# Patient Record
Sex: Male | Born: 1950 | Race: White | Hispanic: No | Marital: Married | Smoking: Never smoker
Health system: Southern US, Community
[De-identification: ages and names within clinical notes are randomized; demographics above are authoritative.]

## PROBLEM LIST (undated history)

## (undated) DIAGNOSIS — R200 Anesthesia of skin: Secondary | ICD-10-CM

## (undated) DIAGNOSIS — N400 Enlarged prostate without lower urinary tract symptoms: Secondary | ICD-10-CM

## (undated) DIAGNOSIS — K2270 Barrett's esophagus without dysplasia: Secondary | ICD-10-CM

## (undated) DIAGNOSIS — I10 Essential (primary) hypertension: Secondary | ICD-10-CM

## (undated) DIAGNOSIS — R739 Hyperglycemia, unspecified: Secondary | ICD-10-CM

## (undated) DIAGNOSIS — N486 Induration penis plastica: Secondary | ICD-10-CM

## (undated) DIAGNOSIS — I714 Abdominal aortic aneurysm, without rupture, unspecified: Secondary | ICD-10-CM

## (undated) DIAGNOSIS — R202 Paresthesia of skin: Secondary | ICD-10-CM

## (undated) DIAGNOSIS — G4730 Sleep apnea, unspecified: Secondary | ICD-10-CM

## (undated) DIAGNOSIS — K219 Gastro-esophageal reflux disease without esophagitis: Secondary | ICD-10-CM

## (undated) DIAGNOSIS — E7800 Pure hypercholesterolemia, unspecified: Secondary | ICD-10-CM

## (undated) DIAGNOSIS — D151 Benign neoplasm of heart: Secondary | ICD-10-CM

## (undated) DIAGNOSIS — M51369 Other intervertebral disc degeneration, lumbar region without mention of lumbar back pain or lower extremity pain: Secondary | ICD-10-CM

## (undated) DIAGNOSIS — M5136 Other intervertebral disc degeneration, lumbar region: Secondary | ICD-10-CM

## (undated) HISTORY — PX: COLONOSCOPY: SHX174

## (undated) HISTORY — PX: EXCISION OF ATRIAL MYXOMA: SHX5821

## (undated) HISTORY — PX: TONSILLECTOMY: SUR1361

---

## 1986-09-29 HISTORY — PX: LUMBAR LAMINECTOMY: SHX95

## 2004-10-03 ENCOUNTER — Ambulatory Visit: Payer: Self-pay | Admitting: Unknown Physician Specialty

## 2008-07-18 ENCOUNTER — Ambulatory Visit: Payer: Self-pay | Admitting: Family Medicine

## 2008-07-26 ENCOUNTER — Encounter: Payer: Self-pay | Admitting: Unknown Physician Specialty

## 2008-07-30 ENCOUNTER — Encounter: Payer: Self-pay | Admitting: Unknown Physician Specialty

## 2012-01-08 ENCOUNTER — Ambulatory Visit: Payer: Self-pay | Admitting: Physical Medicine and Rehabilitation

## 2012-02-11 ENCOUNTER — Ambulatory Visit: Payer: Self-pay | Admitting: Internal Medicine

## 2012-10-19 ENCOUNTER — Ambulatory Visit: Payer: Self-pay | Admitting: General Practice

## 2012-11-26 ENCOUNTER — Ambulatory Visit: Payer: Self-pay | Admitting: General Practice

## 2012-11-26 HISTORY — PX: KNEE ARTHROSCOPY: SHX127

## 2013-05-24 ENCOUNTER — Ambulatory Visit: Payer: Self-pay | Admitting: Internal Medicine

## 2013-05-24 DIAGNOSIS — R0602 Shortness of breath: Secondary | ICD-10-CM

## 2013-07-21 ENCOUNTER — Ambulatory Visit: Payer: Self-pay | Admitting: Anesthesiology

## 2013-07-21 ENCOUNTER — Ambulatory Visit: Payer: Self-pay | Admitting: Unknown Physician Specialty

## 2013-07-21 DIAGNOSIS — I10 Essential (primary) hypertension: Secondary | ICD-10-CM

## 2013-07-22 ENCOUNTER — Ambulatory Visit: Payer: Self-pay | Admitting: Gastroenterology

## 2013-07-22 HISTORY — PX: ESOPHAGOGASTRODUODENOSCOPY: SHX1529

## 2013-07-28 HISTORY — PX: OTHER SURGICAL HISTORY: SHX169

## 2013-09-20 ENCOUNTER — Ambulatory Visit: Payer: Self-pay | Admitting: Internal Medicine

## 2014-07-07 DIAGNOSIS — R7303 Prediabetes: Secondary | ICD-10-CM | POA: Insufficient documentation

## 2014-12-26 DIAGNOSIS — G5602 Carpal tunnel syndrome, left upper limb: Secondary | ICD-10-CM | POA: Insufficient documentation

## 2014-12-26 DIAGNOSIS — M7661 Achilles tendinitis, right leg: Secondary | ICD-10-CM | POA: Insufficient documentation

## 2015-01-02 ENCOUNTER — Ambulatory Visit
Admit: 2015-01-02 | Disposition: A | Discharge status: Home / Self Care | Payer: Self-pay | Attending: Unknown Physician Specialty | Admitting: Unknown Physician Specialty

## 2015-01-19 NOTE — Op Note (Signed)
PATIENT NAME:  Marc Weaver, Marc Weaver MR#:  [id number] DATE OF BIRTH:  17-Jun-1951  DATE OF PROCEDURE:  11/26/2012  PREOPERATIVE DIAGNOSIS: Internal derangement of the right knee.   POSTOPERATIVE DIAGNOSES:  1.  Tear of the posterior horn medial meniscus, right knee.  2.  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   PROCEDURES PERFORMED: Right knee arthroscopy, partial medial meniscectomy and medial chondroplasty.   SURGEON: Laurice Record. Hooten, MD.  ANESTHESIA: General.   ESTIMATED BLOOD LOSS: Minimal.   TOURNIQUET TIME: Not used.   DRAINS: None.   INDICATIONS FOR SURGERY: The patient is a 64 year old gentleman who has been seen for complaints of right knee pain and swelling. He localized most of the pain along the medial aspect of the knee. MRI demonstrated findings consistent with meniscal pathology. After discussion of the risks and benefits of surgical intervention, the patient expressed understanding of the risks and benefits and agreed with plans for surgical intervention.   PROCEDURE IN DETAIL: The patient was brought to the Operating Room and after adequate general anesthesia was achieved, a tourniquet was placed on the patient's right thigh and leg was placed in a leg holder. All bony prominences were well padded. The patient's right knee and leg were cleaned and prepped with alcohol and DuraPrep and draped in the usual sterile fashion. A "timeout" was performed as per usual protocol. The anticipated portal sites were injected with 0.25% Marcaine with epinephrine. An anterolateral portal was created and a cannula was inserted. A moderately large effusion was evacuated. The scope was inserted and the knee was distended with fluid using the Stryker pump. The scope was advanced down the medial gutter into the medial compartment of the knee. Under visualization with the scope, an anteromedial portal was created and a hook probe was inserted. Inspection of the medial compartment  demonstrated a flap-type degenerative tear involving the posterior horn of the medial meniscus. The tear was debrided using meniscal punches and a 4.5 mm shaver. Final contouring was performed using the 50 degree ArthroCare wand. The remaining rim of meniscus was visualized and probed and felt to be stable. Inspection of the articular cartilage demonstrated some mild fraying to the medial femoral condyle. There was an area of grade 4 chondromalacia involving the medial tibial plateau along the medialmost aspect near the area of the tear. The margins were debrided and contoured using the ArthroCare wand. The scope was then advanced into the intercondylar region. The anterior cruciate ligament was visualized and probed and felt to be stable. The scope was removed from the anterolateral portal and reinserted via the anteromedial portal so as to better visualize the lateral compartment. The articular surface of the lateral compartment was in excellent condition. The lateral meniscus was visualized and probed and felt to be stable. Finally, the scope was positioned so as to visualize the patellofemoral articulation. Good patellar tracking was noted. Reasonably good articular surface was noted. The knee was irrigated with copious amounts of fluid and then suctioned dry. The anterolateral portal was reapproximated using 3-0 nylon. A combination of 0.25% Marcaine with epinephrine and 4 mg of morphine was injected via the scope. The scope was removed and the anteromedial portal was reapproximated using 3-0 nylon. A sterile dressing was applied followed by application of an ice wrap.   The patient tolerated the procedure well. He was transported to the recovery room in stable condition.   ____________________________ Laurice Record. Holley Bouche., MD jph:jm D: 11/27/2012 06:41:22 ET T: 11/27/2012 13:55:49 ET JOB#:  [id number]  cc: Laurice Record. Holley Bouche., MD, <Dictator> JAMES P Holley Bouche MD ELECTRONICALLY SIGNED 11/29/2012 6:55

## 2015-02-07 NOTE — Anesthesia Preprocedure Evaluation (Addendum)
Anesthesia Evaluation  Patient identified by MRN, date of birth, ID band Patient awake    Reviewed: Allergy & Precautions, NPO status , Patient's Chart, lab work & pertinent test results  Airway Mallampati: II  TM Distance: >3 FB Neck ROM: Full    Dental no notable dental hx.    Pulmonary neg pulmonary ROS,  breath sounds clear to auscultation  Pulmonary exam normal       Cardiovascular hypertension, Pt. on medications negative cardio ROS Normal cardiovascular examRhythm:Regular Rate:Normal     Neuro/Psych negative neurological ROS  negative psych ROS   GI/Hepatic negative GI ROS, Neg liver ROS, GERD-  Medicated and Controlled,  Endo/Other  negative endocrine ROS  Renal/GU negative Renal ROS  negative genitourinary   Musculoskeletal negative musculoskeletal ROS (+)   Abdominal   Peds negative pediatric ROS (+)  Hematology negative hematology ROS (+)   Anesthesia Other Findings   Reproductive/Obstetrics negative OB ROS                            Anesthesia Physical Anesthesia Plan  ASA: II  Anesthesia Plan: Regional   Post-op Pain Management: MAC Combined w/ Regional for Post-op pain   Induction: Intravenous  Airway Management Planned:   Additional Equipment:   Intra-op Plan:   Post-operative Plan: Extubation in OR  Informed Consent: I have reviewed the patients History and Physical, chart, labs and discussed the procedure including the risks, benefits and alternatives for the proposed anesthesia with the patient or authorized representative who has indicated his/her understanding and acceptance.   Dental advisory given  Plan Discussed with: CRNA  Anesthesia Plan Comments:         Anesthesia Quick Evaluation

## 2015-02-09 ENCOUNTER — Ambulatory Visit: Payer: 59 | Admitting: Anesthesiology

## 2015-02-09 ENCOUNTER — Encounter
Admission: RE | Disposition: A | Discharge status: Home / Self Care | Payer: Self-pay | Source: Ambulatory Visit | Attending: Unknown Physician Specialty

## 2015-02-09 ENCOUNTER — Ambulatory Visit
Admission: RE | Admit: 2015-02-09 | Discharge: 2015-02-09 | Disposition: A | Discharge status: Home / Self Care | Payer: 59 | Source: Ambulatory Visit | Attending: Unknown Physician Specialty | Admitting: Unknown Physician Specialty

## 2015-02-09 DIAGNOSIS — K219 Gastro-esophageal reflux disease without esophagitis: Secondary | ICD-10-CM | POA: Insufficient documentation

## 2015-02-09 DIAGNOSIS — G5602 Carpal tunnel syndrome, left upper limb: Secondary | ICD-10-CM

## 2015-02-09 DIAGNOSIS — Z79899 Other long term (current) drug therapy: Secondary | ICD-10-CM | POA: Insufficient documentation

## 2015-02-09 DIAGNOSIS — I10 Essential (primary) hypertension: Secondary | ICD-10-CM | POA: Diagnosis not present

## 2015-02-09 HISTORY — DX: Benign prostatic hyperplasia without lower urinary tract symptoms: N40.0

## 2015-02-09 HISTORY — PX: CARPAL TUNNEL RELEASE: SHX101

## 2015-02-09 HISTORY — DX: Other intervertebral disc degeneration, lumbar region: M51.36

## 2015-02-09 HISTORY — DX: Barrett's esophagus without dysplasia: K22.70

## 2015-02-09 HISTORY — DX: Other intervertebral disc degeneration, lumbar region without mention of lumbar back pain or lower extremity pain: M51.369

## 2015-02-09 HISTORY — DX: Induration penis plastica: N48.6

## 2015-02-09 HISTORY — DX: Benign neoplasm of heart: D15.1

## 2015-02-09 HISTORY — DX: Anesthesia of skin: R20.2

## 2015-02-09 HISTORY — DX: Hyperglycemia, unspecified: R73.9

## 2015-02-09 HISTORY — DX: Essential (primary) hypertension: I10

## 2015-02-09 HISTORY — DX: Sleep apnea, unspecified: G47.30

## 2015-02-09 HISTORY — DX: Paresthesia of skin: R20.0

## 2015-02-09 HISTORY — DX: Pure hypercholesterolemia, unspecified: E78.00

## 2015-02-09 SURGERY — CARPAL TUNNEL RELEASE
Anesthesia: Regional | Laterality: Left | Wound class: Clean

## 2015-02-09 MED ORDER — BUPIVACAINE HCL (PF) 0.5 % IJ SOLN
INTRAMUSCULAR | Status: DC | PRN
  Administered 2015-02-09: 6 mL

## 2015-02-09 MED ORDER — KETOROLAC TROMETHAMINE 30 MG/ML IJ SOLN: 30.0000 mg | Freq: Once | INTRAMUSCULAR | Status: DC | PRN

## 2015-02-09 MED ORDER — LACTATED RINGERS IV SOLN
INTRAVENOUS | Status: DC
  Administered 2015-02-09: 11:00:00 via INTRAVENOUS

## 2015-02-09 MED ORDER — HYDROMORPHONE HCL 1 MG/ML IJ SOLN: 0.2500 mg | INTRAMUSCULAR | Status: DC | PRN

## 2015-02-09 MED ORDER — HYDROCODONE-ACETAMINOPHEN 5-325 MG PO TABS: 1.0000 | ORAL_TABLET | Freq: Four times a day (QID) | ORAL | Status: DC | PRN

## 2015-02-09 MED ORDER — OXYCODONE HCL 5 MG PO TABS: 5.0000 mg | ORAL_TABLET | Freq: Once | ORAL | Status: DC | PRN

## 2015-02-09 MED ORDER — PROPOFOL 10 MG/ML IV BOLUS
INTRAVENOUS | Status: DC | PRN
  Administered 2015-02-09: 200 mg via INTRAVENOUS

## 2015-02-09 MED ORDER — MIDAZOLAM HCL 5 MG/5ML IJ SOLN
INTRAMUSCULAR | Status: DC | PRN
  Administered 2015-02-09: 2 mg via INTRAVENOUS

## 2015-02-09 MED ORDER — LIDOCAINE HCL (CARDIAC) 20 MG/ML IV SOLN
INTRAVENOUS | Status: DC | PRN
  Administered 2015-02-09: 50 mg via INTRATRACHEAL

## 2015-02-09 MED ORDER — DEXAMETHASONE SODIUM PHOSPHATE 4 MG/ML IJ SOLN
INTRAMUSCULAR | Status: DC | PRN
  Administered 2015-02-09: 4 mg via INTRAVENOUS

## 2015-02-09 MED ORDER — FENTANYL CITRATE (PF) 100 MCG/2ML IJ SOLN
INTRAMUSCULAR | Status: DC | PRN
  Administered 2015-02-09: 100 ug via INTRAVENOUS

## 2015-02-09 MED ORDER — OXYCODONE HCL 5 MG/5ML PO SOLN: 5.0000 mg | Freq: Once | ORAL | Status: DC | PRN

## 2015-02-09 MED ORDER — ONDANSETRON HCL 4 MG/2ML IJ SOLN
INTRAMUSCULAR | Status: DC | PRN
  Administered 2015-02-09: 4 mg via INTRAVENOUS

## 2015-02-09 MED ORDER — PROMETHAZINE HCL 25 MG/ML IJ SOLN: 6.2500 mg | INTRAMUSCULAR | Status: DC | PRN

## 2015-02-09 MED ORDER — GLYCOPYRROLATE 0.2 MG/ML IJ SOLN
INTRAMUSCULAR | Status: DC | PRN
  Administered 2015-02-09: 0.2 mg via INTRAVENOUS

## 2015-02-09 SURGICAL SUPPLY — 26 items
BANDAGE ELASTIC 2 VELCRO NS LF (GAUZE/BANDAGES/DRESSINGS) ×3 IMPLANT
BNDG ESMARK 4X12 TAN STRL LF (GAUZE/BANDAGES/DRESSINGS) ×3 IMPLANT
COVER LIGHT HANDLE FLEXIBLE (MISCELLANEOUS) ×3 IMPLANT
CUFF TOURN SGL QUICK 18 (TOURNIQUET CUFF) ×3 IMPLANT
DURAPREP 26ML APPLICATOR (WOUND CARE) ×3 IMPLANT
GAUZE SPONGE 4X4 12PLY STRL (GAUZE/BANDAGES/DRESSINGS) ×3 IMPLANT
GLOVE BIO SURGEON STRL SZ7.5 (GLOVE) ×3 IMPLANT
GLOVE BIO SURGEON STRL SZ8 (GLOVE) ×6 IMPLANT
GLOVE INDICATOR 8.0 STRL GRN (GLOVE) ×3 IMPLANT
GOWN STRL REUS W/ TWL LRG LVL3 (GOWN DISPOSABLE) ×2 IMPLANT
GOWN STRL REUS W/TWL LRG LVL3 (GOWN DISPOSABLE) ×4
LOOP VESSEL RED MINI 1.3X0.9 (MISCELLANEOUS) ×1 IMPLANT
LOOPS RED MINI 1.3MMX0.9MM (MISCELLANEOUS) ×2
NS IRRIG 500ML POUR BTL (IV SOLUTION) ×3 IMPLANT
PACK EXTREMITY ARMC (MISCELLANEOUS) ×3 IMPLANT
PAD GROUND ADULT SPLIT (MISCELLANEOUS) ×3 IMPLANT
PADDING CAST 2X4YD ST (MISCELLANEOUS) ×2
PADDING CAST BLEND 2X4 STRL (MISCELLANEOUS) ×1 IMPLANT
SOL PREP PVP 2OZ (MISCELLANEOUS) ×3
SOLUTION PREP PVP 2OZ (MISCELLANEOUS) ×1 IMPLANT
SPLINT CAST 1 STEP 3X12 (MISCELLANEOUS) ×3 IMPLANT
STOCKINETTE ORTHO 6X25 (MISCELLANEOUS) ×3 IMPLANT
STRAP BODY AND KNEE 60X3 (MISCELLANEOUS) ×3 IMPLANT
SUT ETHILON 4-0 (SUTURE) ×2
SUT ETHILON 4-0 FS2 18XMFL BLK (SUTURE) ×1
SUTURE ETHLN 4-0 FS2 18XMF BLK (SUTURE) ×1 IMPLANT

## 2015-02-09 NOTE — Op Note (Addendum)
DATE OF SURGERY:  02/09/2015  PATIENT NAME:  Marc Weaver   DOB: 1951-05-01  MRN: [id number]  PRE-OPERATIVE DIAGNOSIS: Left carpal tunnel syndrome  POST-OPERATIVE DIAGNOSIS:  Same  PROCEDURE:  Left carpal tunnel release  SURGEON: Dr. Leanor Kail, Brooke Bonito. M.D.  ANESTHESIA: Supraclavicular block plus general  ESTIMATED BLOOD LOSS: Negligible mL   TOURNIQUET TIME: 13  J minutes  DRAINS: None  INDICATIONS FOR SURGERY: Marc Weaver is a 64 y.o. year old male with a long history of numbness and paresthesias to the left hand. EMG/nerve conduction studies demonstrated findings consistent with carpal tunnel syndrome.The patient had not seen any significant improvement despite conservative nonsurgical intervention. After discussion of the risks and benefits of surgical intervention, the patient expressed understanding of the risks benefits and agree with plans for carpal tunnel release.   PROCEDURE IN DETAIL: The patient was brought into the operating room and after adequate general anesthesia, a tourniquet was placed on the patient's left upper arm.The left hand and arm were prepped with alcohol and Duraprep and draped in the usual sterile fashion. A "time-out" was performed as per usual protocol. The hand and forearm were exsanguinated using an Esmarch and the tourniquet was inflated to 250 mmHg.  An incision was made just ulnar to the thenar palmar crease. Dissection was carried down through the palmar fascia to the transverse carpal ligament. The transverse carpal ligament was sharply incised, taking care to protect the underlying structures with the carpal tunnel. Complete release of the transverse carpal ligament was achieved. There was no evidence of ganglion cyst or lipoma within the carpal tunnel. The wound was irrigated with copious amounts of normal saline with antibiotic solution. The skin was then re-approximated with interrupted sutures of #4-0 nylon. A sterile dressing  was applied followed by application of a volar splint. The tourniquet was deflated with a total tourniquet time of 13 minutes.  The patient tolerated the procedure well and was transported to the PACU in stable condition.   Dr. Mariel Kansky. M.D.

## 2015-02-09 NOTE — H&P (Signed)
  H and P reviewed. No changes. Uploaded at later date. 

## 2015-02-09 NOTE — Transfer of Care (Signed)
Immediate Anesthesia Transfer of Care Note  Patient: Marc Weaver  Procedure(s) Performed: Procedure(s): CARPAL TUNNEL RELEASE (Left)  Patient Location: PACU  Anesthesia Type: Regional  Level of Consciousness: awake, alert  and patient cooperative  Airway and Oxygen Therapy: Patient Spontanous Breathing and Patient connected to supplemental oxygen  Post-op Assessment: Post-op Vital signs reviewed, Patient's Cardiovascular Status Stable, Respiratory Function Stable, Patent Airway and No signs of Nausea or vomiting  Post-op Vital Signs: Reviewed and stable  Complications: No apparent anesthesia complications

## 2015-02-09 NOTE — Anesthesia Procedure Notes (Addendum)
Procedure Name: LMA Insertion Date/Time: 02/09/2015 11:50 AM Performed by: Londell Moh Pre-anesthesia Checklist: Patient identified, Emergency Drugs available, Suction available, Timeout performed and Patient being monitored Patient Re-evaluated:Patient Re-evaluated prior to inductionOxygen Delivery Method: Circle system utilized Preoxygenation: Pre-oxygenation with 100% oxygen Intubation Type: IV induction LMA: LMA inserted LMA Size: 4.0 Number of attempts: 1 Placement Confirmation: positive ETCO2 and breath sounds checked- equal and bilateral Tube secured with: Tape   Anesthesia Regional Block:  Supraclavicular block  Pre-Anesthetic Checklist: ,, timeout performed, Correct Patient, Correct Site, Correct Laterality, Correct Procedure, Correct Position, site marked, Risks and benefits discussed,  Surgical consent,  Pre-op evaluation,  At surgeon's request and post-op pain management  Laterality: Left  Prep: chloraprep       Needles:  Injection technique: Single-shot  Needle Type: Echogenic Stimulator Needle      Needle Gauge: 21 and 21 G  Needle insertion depth: 3 cm   Additional Needles:  Procedures: ultrasound guided (picture in chart) and nerve stimulator Supraclavicular block  Nerve Stimulator or Paresthesia:  Response: bicep contraction, 0.45 mA,   Additional Responses:   Narrative:  Injection made incrementally with aspirations every 5 mL.  Performed by: Personally   Additional Notes: Functioning IV was confirmed and monitors applied.  Sterile prep and drape,hand hygiene and sterile gloves were used.Ultrasound guidance: relevant anatomy identified, needle position confirmed, local anesthetic spread visualized around nerve(s)., vascular puncture avoided.  Image printed for medical record.  Negative aspiration and negative test dose prior to incremental administration of local anesthetic. The patient tolerated the procedure well. Vitals signes recorded in RN  notes.

## 2015-02-09 NOTE — Anesthesia Postprocedure Evaluation (Signed)
  Anesthesia Post-op Note  Patient: Marc Weaver  Procedure(s) Performed: Procedure(s): CARPAL TUNNEL RELEASE (Left)  Anesthesia type:Regional  Patient location: PACU  Post pain: Pain level controlled  Post assessment: Post-op Vital signs reviewed, Patient's Cardiovascular Status Stable, Respiratory Function Stable, Patent Airway and No signs of Nausea or vomiting  Post vital signs: Reviewed and stable  Last Vitals:  Filed Vitals:   02/09/15 1245  BP: 115/89  Pulse: 66  Temp:   Resp: 17    Level of consciousness: awake, alert  and patient cooperative  Complications: No apparent anesthesia complications

## 2015-02-09 NOTE — Discharge Instructions (Signed)
General Anesthesia, Care After Refer to this sheet in the next few weeks. These instructions provide you with information on caring for yourself after your procedure. Your health care provider may also give you more specific instructions. Your treatment has been planned according to current medical practices, but problems sometimes occur. Call your health care provider if you have any problems or questions after your procedure. WHAT TO EXPECT AFTER THE PROCEDURE After the procedure, it is typical to experience:  Sleepiness.  Nausea and vomiting. HOME CARE INSTRUCTIONS  For the first 24 hours after general anesthesia:  Have a responsible person with you.  Do not drive a car. If you are alone, do not take public transportation.  Do not drink alcohol.  Do not take medicine that has not been prescribed by your health care provider.  Do not sign important papers or make important decisions.  You may resume a normal diet and activities as directed by your health care provider.  Change bandages (dressings) as directed.  If you have questions or problems that seem related to general anesthesia, call the hospital and ask for the anesthetist or anesthesiologist on call. SEEK MEDICAL CARE IF:  You have nausea and vomiting that continue the day after anesthesia.  You develop a rash. SEEK IMMEDIATE MEDICAL CARE IF:   You have difficulty breathing.  You have chest pain.  You have any allergic problems.  Document Released: 12/22/2000 Document Revised: 09/20/2013 Document Reviewed: 03/31/2013 [facility] Patient Information 2015 [place], [place]. This information is not intended to replace advice given to you by your health care provider. Make sure you discuss any questions you have with your health care provider.  Carpal Tunnel Release Carpal tunnel release is done to relieve the pressure on the nerves and tendons on the bottom side of your wrist.  LET YOUR CAREGIVER KNOW ABOUT:    Allergies to food or medicine.  Medicines taken, including vitamins, herbs, eyedrops, over-the-counter medicines, and creams.  Use of steroids (by mouth or creams).  Previous problems with anesthetics or numbing medicines.  History of bleeding problems or blood clots.  Previous surgery.  Other health problems, including diabetes and kidney problems.  Possibility of pregnancy, if this applies. RISKS AND COMPLICATIONS  Some problems that may happen after this procedure include:  Infection.  Damage to the nerves, arteries or tendons could occur. This would be very uncommon.  Bleeding. BEFORE THE PROCEDURE   This surgery may be done while you are asleep (general anesthetic) or may be done under a block where only your forearm and the surgical area is numb.  If the surgery is done under a block, the numbness will gradually wear off within several hours after surgery. HOME CARE INSTRUCTIONS   Have a responsible person with you for 24 hours.  Do not drive a car or use public transportation for 24 hours.  Only take over-the-counter or prescription medicines for pain, discomfort, or fever as directed by your caregiver. Take them as directed.  You may put ice on the palm side of the affected wrist.  Put ice in a plastic bag.  Place a towel between your skin and the bag.  Leave the ice on for 20 to 30 minutes, 4 times per day.  If you were given a splint to keep your wrist from bending, use it as directed. It is important to wear the splint at night or as directed. Use the splint for as long as you have pain or numbness in your hand, arm,  or wrist. This may take 1 to 2 months.  Keep your hand raised (elevated) above the level of your heart as much as possible. This keeps swelling down and helps with discomfort.  Change bandages (dressings) as directed.  Keep the wound clean and dry. SEEK MEDICAL CARE IF:   You develop pain not relieved with medications.  You develop  numbness of your hand.  You develop bleeding from your surgical site.  You have an oral temperature above 102 F (38.9 C).  You develop redness or swelling of the surgical site.  You develop new, unexplained problems. SEEK IMMEDIATE MEDICAL CARE IF:   You develop a rash.  You have difficulty breathing.  You develop any reaction or side effects to medications given. Document Released: 12/06/2003 Document Revised: 12/08/2011 Document Reviewed: 07/22/2007 [facility] Patient Information 2015 [place], [place]. This information is not intended to replace advice given to you by your health care provider. Make sure you discuss any questions you have with your health care provider.

## 2015-02-12 ENCOUNTER — Encounter: Payer: Self-pay | Admitting: Unknown Physician Specialty

## 2015-09-25 ENCOUNTER — Encounter: Payer: Self-pay | Admitting: *Deleted

## 2015-10-02 NOTE — Discharge Instructions (Signed)

## 2015-10-03 ENCOUNTER — Ambulatory Visit
Admission: RE | Admit: 2015-10-03 | Discharge: 2015-10-03 | Disposition: A | Discharge status: Home / Self Care | Payer: 59 | Source: Ambulatory Visit | Attending: Gastroenterology | Admitting: Gastroenterology

## 2015-10-03 ENCOUNTER — Ambulatory Visit: Payer: 59 | Admitting: Anesthesiology

## 2015-10-03 ENCOUNTER — Encounter
Admission: RE | Disposition: A | Discharge status: Home / Self Care | Payer: Self-pay | Source: Ambulatory Visit | Attending: Gastroenterology

## 2015-10-03 DIAGNOSIS — N486 Induration penis plastica: Secondary | ICD-10-CM | POA: Insufficient documentation

## 2015-10-03 DIAGNOSIS — K2270 Barrett's esophagus without dysplasia: Secondary | ICD-10-CM | POA: Diagnosis present

## 2015-10-03 DIAGNOSIS — G4730 Sleep apnea, unspecified: Secondary | ICD-10-CM | POA: Insufficient documentation

## 2015-10-03 DIAGNOSIS — K219 Gastro-esophageal reflux disease without esophagitis: Secondary | ICD-10-CM | POA: Diagnosis not present

## 2015-10-03 DIAGNOSIS — E7800 Pure hypercholesterolemia, unspecified: Secondary | ICD-10-CM | POA: Insufficient documentation

## 2015-10-03 DIAGNOSIS — N400 Enlarged prostate without lower urinary tract symptoms: Secondary | ICD-10-CM | POA: Insufficient documentation

## 2015-10-03 DIAGNOSIS — K5730 Diverticulosis of large intestine without perforation or abscess without bleeding: Secondary | ICD-10-CM | POA: Diagnosis not present

## 2015-10-03 DIAGNOSIS — R739 Hyperglycemia, unspecified: Secondary | ICD-10-CM | POA: Insufficient documentation

## 2015-10-03 DIAGNOSIS — Z791 Long term (current) use of non-steroidal anti-inflammatories (NSAID): Secondary | ICD-10-CM | POA: Diagnosis not present

## 2015-10-03 DIAGNOSIS — M5137 Other intervertebral disc degeneration, lumbosacral region: Secondary | ICD-10-CM | POA: Diagnosis not present

## 2015-10-03 DIAGNOSIS — K210 Gastro-esophageal reflux disease with esophagitis: Secondary | ICD-10-CM | POA: Diagnosis present

## 2015-10-03 DIAGNOSIS — Z79899 Other long term (current) drug therapy: Secondary | ICD-10-CM | POA: Diagnosis not present

## 2015-10-03 DIAGNOSIS — I10 Essential (primary) hypertension: Secondary | ICD-10-CM | POA: Diagnosis not present

## 2015-10-03 DIAGNOSIS — R194 Change in bowel habit: Secondary | ICD-10-CM | POA: Diagnosis present

## 2015-10-03 DIAGNOSIS — Z7982 Long term (current) use of aspirin: Secondary | ICD-10-CM | POA: Insufficient documentation

## 2015-10-03 HISTORY — PX: COLONOSCOPY: SHX5424

## 2015-10-03 HISTORY — PX: ESOPHAGOGASTRODUODENOSCOPY: SHX5428

## 2015-10-03 SURGERY — COLONOSCOPY
Anesthesia: Monitor Anesthesia Care | Wound class: Contaminated

## 2015-10-03 MED ORDER — LIDOCAINE HCL (CARDIAC) 20 MG/ML IV SOLN
INTRAVENOUS | Status: DC | PRN
Start: 2015-10-03 — End: 2015-10-03
  Administered 2015-10-03: 40 mg via INTRAVENOUS

## 2015-10-03 MED ORDER — ACETAMINOPHEN 325 MG PO TABS: 325.0000 mg | ORAL_TABLET | ORAL | Status: DC | PRN

## 2015-10-03 MED ORDER — OXYCODONE HCL 5 MG/5ML PO SOLN: 5.0000 mg | Freq: Once | ORAL | Status: DC | PRN

## 2015-10-03 MED ORDER — ACETAMINOPHEN 160 MG/5ML PO SOLN: 325.0000 mg | ORAL | Status: DC | PRN

## 2015-10-03 MED ORDER — SODIUM CHLORIDE 0.9 % IV SOLN: INTRAVENOUS | Status: DC

## 2015-10-03 MED ORDER — LACTATED RINGERS IV SOLN
INTRAVENOUS | Status: DC
  Administered 2015-10-03: 08:00:00 via INTRAVENOUS

## 2015-10-03 MED ORDER — OXYCODONE HCL 5 MG PO TABS: 5.0000 mg | ORAL_TABLET | Freq: Once | ORAL | Status: DC | PRN

## 2015-10-03 MED ORDER — DEXAMETHASONE SODIUM PHOSPHATE 4 MG/ML IJ SOLN: 8.0000 mg | Freq: Once | INTRAMUSCULAR | Status: DC | PRN

## 2015-10-03 MED ORDER — GLYCOPYRROLATE 0.2 MG/ML IJ SOLN
INTRAMUSCULAR | Status: DC | PRN
  Administered 2015-10-03: 0.2 mg via INTRAVENOUS

## 2015-10-03 MED ORDER — PROPOFOL 10 MG/ML IV BOLUS
INTRAVENOUS | Status: DC | PRN
  Administered 2015-10-03 (×7): 50 mg via INTRAVENOUS

## 2015-10-03 MED ORDER — LACTATED RINGERS IV SOLN: 500.0000 mL | INTRAVENOUS | Status: DC

## 2015-10-03 MED ORDER — FENTANYL CITRATE (PF) 100 MCG/2ML IJ SOLN: 25.0000 ug | INTRAMUSCULAR | Status: DC | PRN

## 2015-10-03 MED ORDER — STERILE WATER FOR IRRIGATION IR SOLN
Status: DC | PRN
  Administered 2015-10-03: 09:00:00

## 2015-10-03 SURGICAL SUPPLY — 41 items
BALLN DILATOR 10-12 8 (BALLOONS)
BALLN DILATOR 12-15 8 (BALLOONS)
BALLN DILATOR 15-18 8 (BALLOONS)
BALLN DILATOR CRE 0-12 8 (BALLOONS)
BALLN DILATOR ESOPH 8 10 CRE (MISCELLANEOUS) IMPLANT
BALLOON DILATOR 12-15 8 (BALLOONS) IMPLANT
BALLOON DILATOR 15-18 8 (BALLOONS) IMPLANT
BALLOON DILATOR CRE 0-12 8 (BALLOONS) IMPLANT
BLOCK BITE 60FR ADLT L/F GRN (MISCELLANEOUS) ×3 IMPLANT
CANISTER SUCT 1200ML W/VALVE (MISCELLANEOUS) ×3 IMPLANT
FCP ESCP3.2XJMB 240X2.8X (MISCELLANEOUS)
FORCEPS BIOP RAD 4 LRG CAP 4 (CUTTING FORCEPS) ×3 IMPLANT
FORCEPS BIOP RJ4 240 W/NDL (MISCELLANEOUS)
FORCEPS ESCP3.2XJMB 240X2.8X (MISCELLANEOUS) IMPLANT
GOWN CVR UNV OPN BCK APRN NK (MISCELLANEOUS) ×1 IMPLANT
GOWN ISOL THUMB LOOP REG UNIV (MISCELLANEOUS) ×2
GOWN STRL REUS W/ TWL LRG LVL3 (GOWN DISPOSABLE) ×1 IMPLANT
GOWN STRL REUS W/TWL LRG LVL3 (GOWN DISPOSABLE) ×2
HEMOCLIP INSTINCT (CLIP) IMPLANT
INJECTOR VARIJECT VIN23 (MISCELLANEOUS) IMPLANT
KIT CO2 TUBING (TUBING) IMPLANT
KIT DEFENDO VALVE AND CONN (KITS) IMPLANT
KIT ENDO PROCEDURE OLY (KITS) ×3 IMPLANT
LIGATOR MULTIBAND 6SHOOTER MBL (MISCELLANEOUS) IMPLANT
MARKER SPOT ENDO TATTOO 5ML (MISCELLANEOUS) IMPLANT
PAD GROUND ADULT SPLIT (MISCELLANEOUS) IMPLANT
SNARE SHORT THROW 13M SML OVAL (MISCELLANEOUS) IMPLANT
SNARE SHORT THROW 30M LRG OVAL (MISCELLANEOUS) IMPLANT
SPOT EX ENDOSCOPIC TATTOO (MISCELLANEOUS)
SUCTION POLY TRAP 4CHAMBER (MISCELLANEOUS) IMPLANT
SYR INFLATION 60ML (SYRINGE) IMPLANT
TRAP SUCTION POLY (MISCELLANEOUS) IMPLANT
TUBING CONN 6MMX3.1M (TUBING)
TUBING SUCTION CONN 0.25 STRL (TUBING) IMPLANT
UNDERPAD 30X60 958B10 (PK) (MISCELLANEOUS) IMPLANT
VALVE BIOPSY ENDO (VALVE) IMPLANT
VARIJECT INJECTOR VIN23 (MISCELLANEOUS)
WATER AUXILLARY (MISCELLANEOUS) IMPLANT
WATER STERILE IRR 250ML POUR (IV SOLUTION) ×3 IMPLANT
WATER STERILE IRR 500ML POUR (IV SOLUTION) IMPLANT
WIRE CRE 18-20MM 8CM F G (MISCELLANEOUS) IMPLANT

## 2015-10-03 NOTE — Op Note (Signed)
[facility] Gastroenterology Patient Name: Marc Weaver Procedure Date: 10/03/2015 8:45 AM MRN: [id number] Account #: [id number] Date of Birth: [birth year] Admit Type: Outpatient Age: 65 Room: [facility] OR ROOM 01 Gender: Male Note Status: Finalized Procedure:         Colonoscopy Indications:       Chronic diarrhea, Change in bowel habits Providers:         Lupita Dawn. Candace Cruise, MD Medicines:         Monitored Anesthesia Care Complications:     No immediate complications. Procedure:         Pre-Anesthesia Assessment:                    - Prior to the procedure, a History and Physical was                     performed, and patient medications, allergies and                     sensitivities were reviewed. The patient's tolerance of                     previous anesthesia was reviewed.                    - The risks and benefits of the procedure and the sedation                     options and risks were discussed with the patient. All                     questions were answered and informed consent was obtained.                    - After reviewing the risks and benefits, the patient was                     deemed in satisfactory condition to undergo the procedure.                    After obtaining informed consent, the colonoscope was                     passed under direct vision. Throughout the procedure, the                     patient's blood pressure, pulse, and oxygen saturations                     were monitored continuously. The Olympus CF-HQ190L                     Colonoscope (S#. [id number]) was introduced through the anus                     and advanced to the the terminal ileum, with                     identification of the appendiceal orifice and IC valve.                     The colonoscopy was performed without difficulty. The                     patient tolerated the procedure well. The quality  of the                     bowel preparation was fair. Findings:     Decreased sphincter tone      The terminal ileum appeared normal. Biopsies were taken with a cold       forceps for histology.      Multiple small-mouthed diverticula were found in the sigmoid colon.       Biopsies for histology were taken with a cold forceps from the entire       colon for evaluation of microscopic colitis.      The exam was otherwise without abnormality. Impression:        - The examined portion of the ileum was normal. Biopsied.                    - Diverticulosis in the sigmoid colon. Biopsied.                    - The examination was otherwise normal. Recommendation:    - Discharge patient to home.                    - Await pathology results.                    - High fiber diet daily. Procedure Code(s): --- Professional ---                    [id number], Colonoscopy, flexible; with biopsy, single or                     multiple Diagnosis Code(s): --- Professional ---                    K52.9, Noninfective gastroenteritis and colitis,                     unspecified                    R19.4, Change in bowel habit                    K57.30, Diverticulosis of large intestine without                     perforation or abscess without bleeding CPT copyright 2014 [employer]. All rights reserved. The codes documented in this report are preliminary and upon coder review may  be revised to meet current compliance requirements. Hulen Luster, MD 10/03/2015 9:34:02 AM This report has been signed electronically. Number of Addenda: 0 Note Initiated On: 10/03/2015 8:45 AM Scope Withdrawal Time: 0 hours 4 minutes 24 seconds  Total Procedure Duration: 0 hours 6 minutes 36 seconds       [facility]

## 2015-10-03 NOTE — Transfer of Care (Signed)
Immediate Anesthesia Transfer of Care Note  Patient: Marc Weaver  Procedure(s) Performed: Procedure(s): COLONOSCOPY (N/A) ESOPHAGOGASTRODUODENOSCOPY (EGD) (N/A)  Patient Location: PACU  Anesthesia Type: MAC  Level of Consciousness: awake, alert  and patient cooperative  Airway and Oxygen Therapy: Patient Spontanous Breathing and Patient connected to supplemental oxygen  Post-op Assessment: Post-op Vital signs reviewed, Patient's Cardiovascular Status Stable, Respiratory Function Stable, Patent Airway and No signs of Nausea or vomiting  Post-op Vital Signs: Reviewed and stable  Complications: No apparent anesthesia complications

## 2015-10-03 NOTE — H&P (Signed)
  Date of Initial H&P: 09/18/2015  History reviewed, patient examined, no change in status, stable for surgery.

## 2015-10-03 NOTE — Op Note (Signed)
[facility] Gastroenterology Patient Name: Marc Weaver Procedure Date: 10/03/2015 8:45 AM MRN: [id number] Account #: [id number] Date of Birth: 05-30-1951 Admit Type: Outpatient Age: 65 Room: [facility] OR ROOM 01 Gender: Male Note Status: Finalized Procedure:         Upper GI endoscopy Indications:       Follow-up of esophageal reflux, Follow-up of Barrett's                     esophagus Providers:         Lupita Dawn. Candace Cruise, MD Referring MD:      Ramonita Lab, MD (Referring MD) Medicines:         Monitored Anesthesia Care Complications:     No immediate complications. Procedure:         Pre-Anesthesia Assessment:                    - Prior to the procedure, a History and Physical was                     performed, and patient medications, allergies and                     sensitivities were reviewed. The patient's tolerance of                     previous anesthesia was reviewed.                    - The risks and benefits of the procedure and the sedation                     options and risks were discussed with the patient. All                     questions were answered and informed consent was obtained.                    - After reviewing the risks and benefits, the patient was                     deemed in satisfactory condition to undergo the procedure.                    After obtaining informed consent, the endoscope was passed                     under direct vision. Throughout the procedure, the                     patient's blood pressure, pulse, and oxygen saturations                     were monitored continuously. The was introduced through                     the mouth, and advanced to the second part of duodenum.                     The upper GI endoscopy was accomplished without                     difficulty. The patient tolerated the procedure well. Findings:      There were esophageal mucosal changes suspicious for short-segment  Barrett's esophagus  present at the gastroesophageal junction. The       maximum longitudinal extent of these mucosal changes was 2 cm in length.       Mucosa was biopsied with a cold forceps for histology in 4 quadrants at       the gastroesophageal junction. One specimen bottle was sent to pathology.      The exam was otherwise without abnormality.      The entire examined stomach was normal.      The examined duodenum was normal. Impression:        - Esophageal mucosal changes suspicious for short-segment                     Barrett's esophagus. Biopsied.                    - The examination was otherwise normal.                    - Normal stomach.                    - Normal examined duodenum. Recommendation:    - Discharge patient to home.                    - Observe patient's clinical course.                    - Await pathology results.                    - The findings and recommendations were discussed with the                     patient.                    - Continue present medications.                    - Repeat the upper endoscopy in 3 years for surveillance                     based on pathology results. Procedure Code(s): --- Professional ---                    [phone], Esophagogastroduodenoscopy, flexible, transoral;                     with biopsy, single or multiple Diagnosis Code(s): --- Professional ---                    K22.70, Barrett's esophagus without dysplasia                    K21.9, Gastro-esophageal reflux disease without esophagitis CPT copyright 2014 American Medical Association. All rights reserved. The codes documented in this report are preliminary and upon coder review may  be revised to meet current compliance requirements. Hulen Luster, MD 10/03/2015 9:21:36 AM This report has been signed electronically. Number of Addenda: 0 Note Initiated On: 10/03/2015 8:45 AM      [facility]

## 2015-10-03 NOTE — Anesthesia Postprocedure Evaluation (Signed)
Anesthesia Post Note  Patient: Marc Weaver  Procedure(s) Performed: Procedure(s) (LRB): COLONOSCOPY (N/A) ESOPHAGOGASTRODUODENOSCOPY (EGD) (N/A)  Patient location during evaluation: PACU Anesthesia Type: MAC Level of consciousness: awake and alert Pain management: pain level controlled Vital Signs Assessment: post-procedure vital signs reviewed and stable Respiratory status: spontaneous breathing, nonlabored ventilation, respiratory function stable and patient connected to nasal cannula oxygen Cardiovascular status: blood pressure returned to baseline and stable Postop Assessment: no signs of nausea or vomiting Anesthetic complications: no    Marc Weaver

## 2015-10-03 NOTE — Anesthesia Procedure Notes (Signed)
Procedure Name: MAC Performed by: Jailyne Chieffo Pre-anesthesia Checklist: Patient identified, Emergency Drugs available, Suction available, Timeout performed and Patient being monitored Patient Re-evaluated:Patient Re-evaluated prior to inductionOxygen Delivery Method: Nasal cannula Placement Confirmation: positive ETCO2     

## 2015-10-03 NOTE — Anesthesia Preprocedure Evaluation (Signed)
Anesthesia Evaluation  Patient identified by MRN, date of birth, ID band Patient awake    Reviewed: Allergy & Precautions, H&P , NPO status , Patient's Chart, lab work & pertinent test results, reviewed documented beta blocker date and time   Airway Mallampati: II  TM Distance: >3 FB Neck ROM: full    Dental no notable dental hx.    Pulmonary sleep apnea and Continuous Positive Airway Pressure Ventilation ,    Pulmonary exam normal breath sounds clear to auscultation       Cardiovascular Exercise Tolerance: Good hypertension, On Medications  Rhythm:regular Rate:Normal     Neuro/Psych negative neurological ROS  negative psych ROS   GI/Hepatic negative GI ROS, Neg liver ROS,   Endo/Other  negative endocrine ROS  Renal/GU negative Renal ROS  negative genitourinary   Musculoskeletal   Abdominal   Peds  Hematology negative hematology ROS (+)   Anesthesia Other Findings   Reproductive/Obstetrics negative OB ROS                             Anesthesia Physical Anesthesia Plan  ASA: II  Anesthesia Plan: MAC   Post-op Pain Management:    Induction:   Airway Management Planned:   Additional Equipment:   Intra-op Plan:   Post-operative Plan:   Informed Consent: I have reviewed the patients History and Physical, chart, labs and discussed the procedure including the risks, benefits and alternatives for the proposed anesthesia with the patient or authorized representative who has indicated his/her understanding and acceptance.     Plan Discussed with: CRNA  Anesthesia Plan Comments:         Anesthesia Quick Evaluation

## 2015-10-04 ENCOUNTER — Encounter: Payer: Self-pay | Admitting: Gastroenterology

## 2015-10-05 LAB — SURGICAL PATHOLOGY

## 2017-02-14 IMAGING — MR MRI OF THE RIGHT ANKLE WITHOUT CONTRAST
5 series · 40 of 40 positions shown · non-contrast
Comparison: None.

CLINICAL DATA: Constant right heel pain for 1 year, worsened over
the past 4-5 months.

EXAM:
MRI OF THE RIGHT ANKLE WITHOUT CONTRAST
TECHNIQUE: Multiplanar, multisequence MR imaging of the ankle was performed. No
intravenous contrast was administered.

[Series 3: PD fat-sat · axial · 3.0mm · 0.62mm/px · z∈[-83,+39]mm · 9 of 38 slices shown]
[im 1/38]
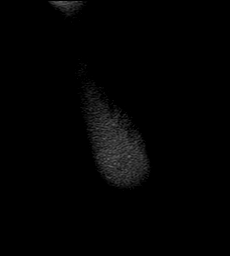
[im 5/38]
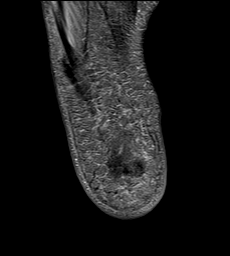
[im 10/38]
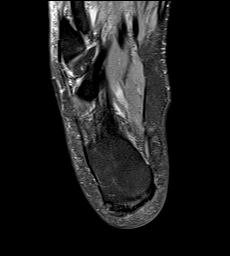
[im 14/38]
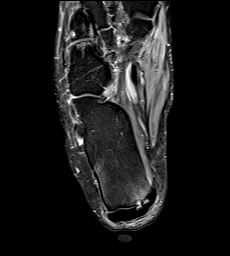
[im 19/38]
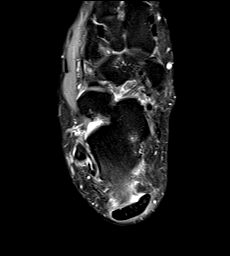
[im 24/38]
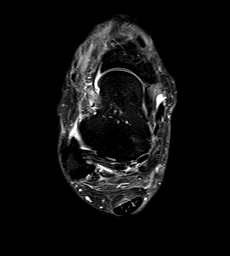
[im 28/38]
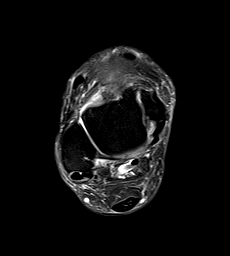
[im 33/38]
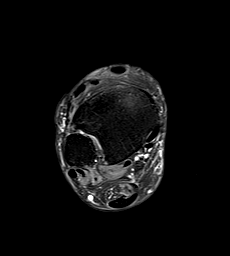
[im 38/38]
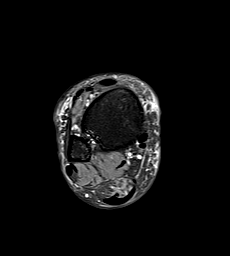

[Series 4: T2 fat-sat · axial · 3.0mm · 0.50mm/px · z∈[-83,+39]mm · 9 of 38 slices shown (1 of 3)]
[im 1/38]
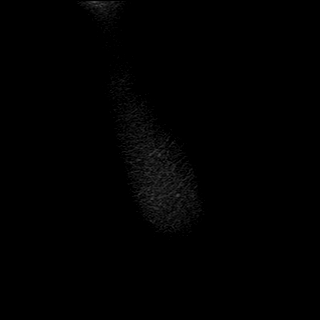
[im 5/38]
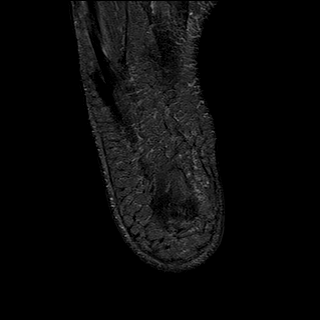
[im 10/38]
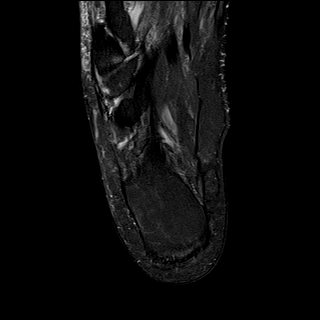
[im 14/38]
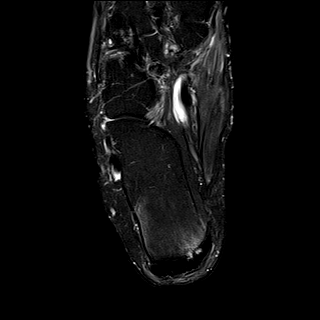
[im 19/38]
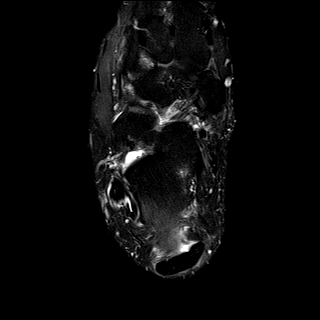
[im 24/38]
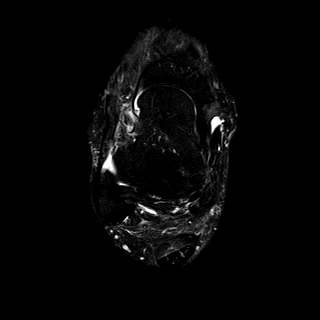
[im 28/38]
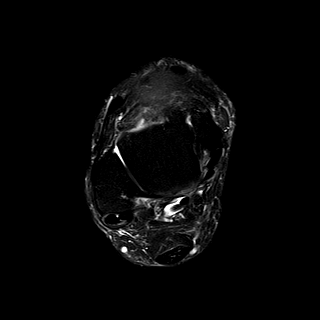
[im 33/38]
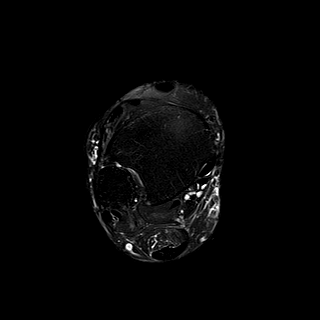
[im 38/38]
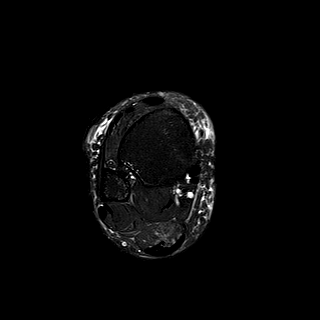

[Series 5: T2 fat-sat · coronal · 3.0mm · 0.56mm/px · 10 of 42 slices shown (2 of 3)]
[im 1/42]
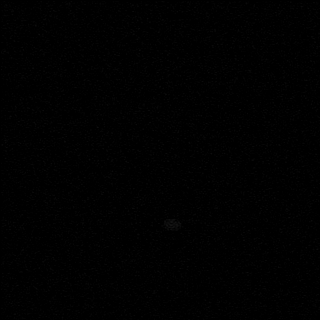
[im 5/42]
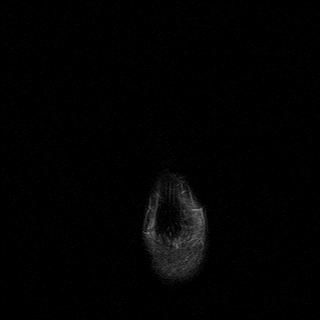
[im 10/42]
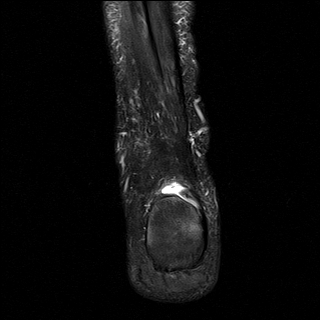
[im 14/42]
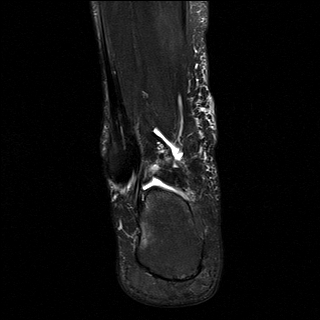
[im 19/42]
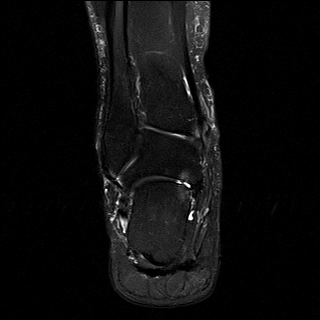
[im 23/42]
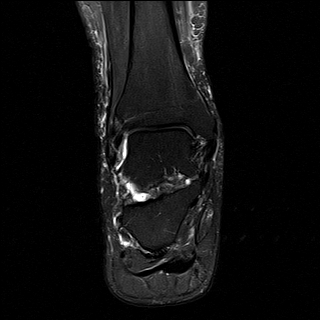
[im 28/42]
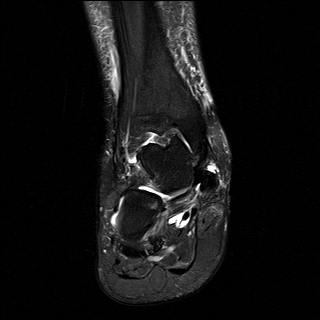
[im 32/42]
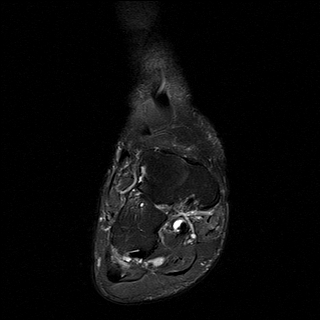
[im 37/42]
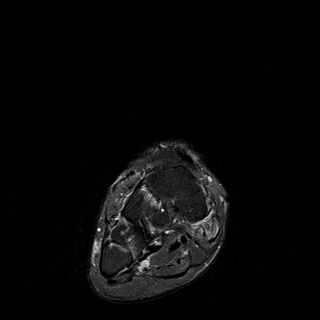
[im 42/42]
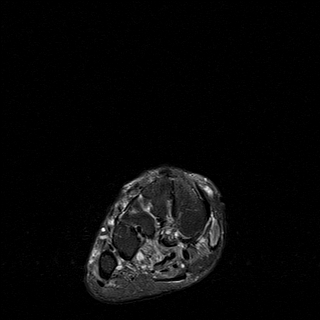

[Series 6: T1 · sagittal · 3.0mm · 0.47mm/px · 6 of 26 slices shown]
[im 1/26]
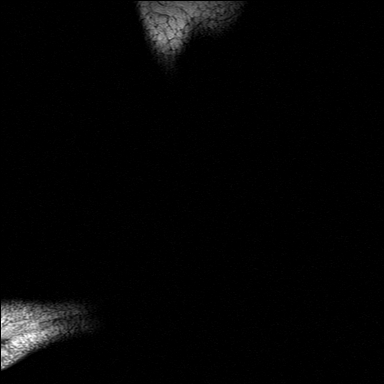
[im 6/26]
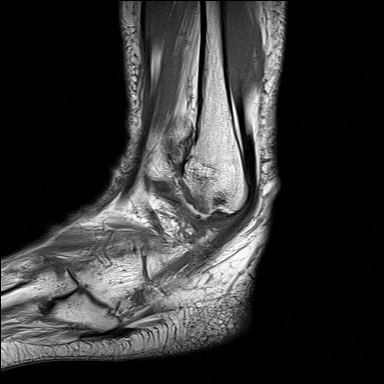
[im 11/26]
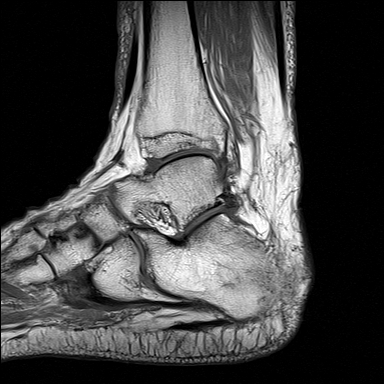
[im 16/26]
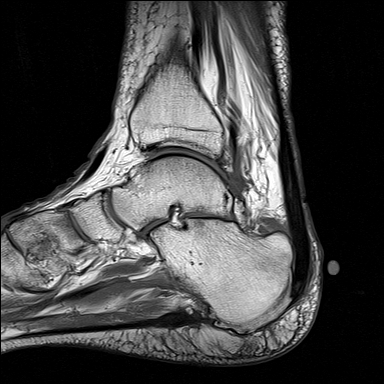
[im 21/26]
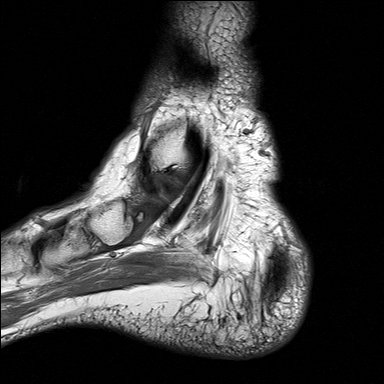
[im 26/26]
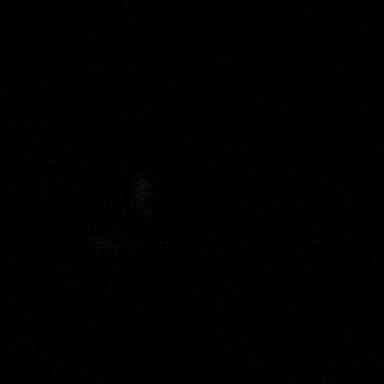

[Series 7: T2 fat-sat · sagittal · 3.0mm · 0.70mm/px · 6 of 26 slices shown (3 of 3)]
[im 1/26]
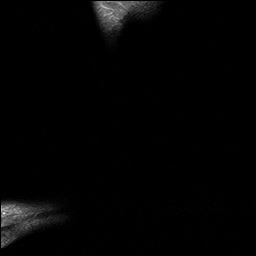
[im 6/26]
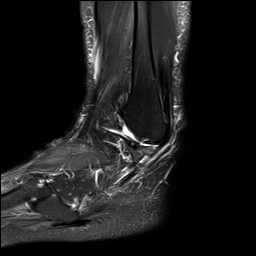
[im 11/26]
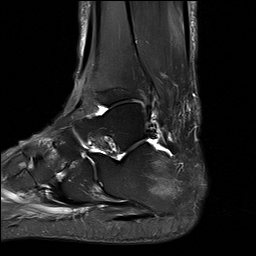
[im 16/26]
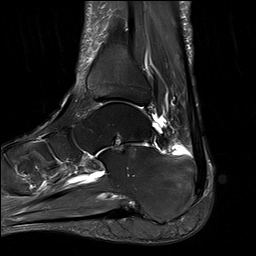
[im 21/26]
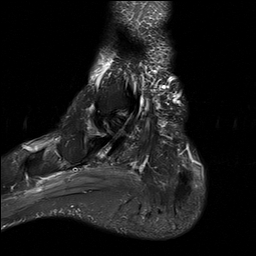
[im 26/26]
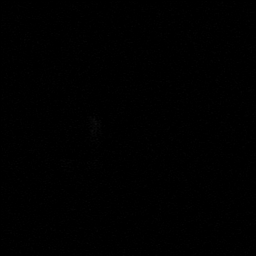

[40 of 40 positions shown; findings below may reference images not displayed]

FINDINGS: TENDONS

Peroneal: Fluid is seen in the sheaths of the peroneal tendons
compatible with tenosynovitis but the tendons are intact.

Posteromedial: Intact.

Anterior: Intact.

Achilles: Mild thickening and intrasubstance increased T2 signal in
the distal tendon are consistent with tendinosis without tear. There
is associated mild reactive marrow edema within the posterior
calcaneus and fluid in the retrocalcaneal bursa.

Plantar Fascia: Intact with normal signal. Small calcaneal spur is
noted.

LIGAMENTS

Lateral: Chronic appearing tear of the anterior talofibular ligament
from the talus is identified. Lateral ligaments are otherwise
unremarkable.

Medial: Intact.

CARTILAGE

Ankle Joint: No osteochondral lesion of the talar dome is
identified. The joint is unremarkable in appearance.

Subtalar Joints/Sinus Tarsi: Degenerative change is seen about the
posterior facet of the subtalar joint where there is hyaline
cartilage loss and subchondral cyst formation.

Bones: No fracture or worrisome marrow lesion is identified.
Degenerative change is also noted about the fourth tarsometatarsal
joint.
IMPRESSION: Achilles tendinosis with associated mild reactive marrow edema in
the posterior calcaneus and retrocalcaneal bursitis. The Achilles is
intact.

Tenosynovitis of the peroneal tendons without tear.

Chronic ATFL tear.

Mild to moderate degenerative change about the ankle most notable at
the posterior facet of the subtalar joint.

## 2017-03-02 ENCOUNTER — Other Ambulatory Visit: Payer: Self-pay | Admitting: Internal Medicine

## 2017-03-02 DIAGNOSIS — I714 Abdominal aortic aneurysm, without rupture, unspecified: Secondary | ICD-10-CM | POA: Insufficient documentation

## 2017-03-02 DIAGNOSIS — I340 Nonrheumatic mitral (valve) insufficiency: Secondary | ICD-10-CM | POA: Insufficient documentation

## 2017-03-03 ENCOUNTER — Ambulatory Visit
Admission: RE | Admit: 2017-03-03 | Discharge: 2017-03-03 | Disposition: A | Discharge status: Home / Self Care | Payer: 59 | Source: Ambulatory Visit | Attending: Internal Medicine | Admitting: Internal Medicine

## 2017-03-03 DIAGNOSIS — I714 Abdominal aortic aneurysm, without rupture, unspecified: Secondary | ICD-10-CM

## 2017-08-03 DIAGNOSIS — M7582 Other shoulder lesions, left shoulder: Secondary | ICD-10-CM | POA: Insufficient documentation

## 2017-08-03 DIAGNOSIS — M75121 Complete rotator cuff tear or rupture of right shoulder, not specified as traumatic: Secondary | ICD-10-CM | POA: Insufficient documentation

## 2017-08-05 ENCOUNTER — Other Ambulatory Visit: Payer: Self-pay | Admitting: Surgery

## 2017-08-05 DIAGNOSIS — M75122 Complete rotator cuff tear or rupture of left shoulder, not specified as traumatic: Secondary | ICD-10-CM

## 2017-08-05 DIAGNOSIS — M7582 Other shoulder lesions, left shoulder: Secondary | ICD-10-CM

## 2017-08-13 ENCOUNTER — Ambulatory Visit
Admission: RE | Admit: 2017-08-13 | Discharge: 2017-08-13 | Disposition: A | Discharge status: Home / Self Care | Payer: 59 | Source: Ambulatory Visit | Attending: Surgery | Admitting: Surgery

## 2017-08-13 DIAGNOSIS — M75122 Complete rotator cuff tear or rupture of left shoulder, not specified as traumatic: Secondary | ICD-10-CM

## 2017-08-13 DIAGNOSIS — M7582 Other shoulder lesions, left shoulder: Secondary | ICD-10-CM | POA: Insufficient documentation

## 2017-09-03 ENCOUNTER — Encounter
Admission: RE | Admit: 2017-09-03 | Discharge: 2017-09-03 | Disposition: A | Discharge status: Home / Self Care | Payer: 59 | Source: Ambulatory Visit | Attending: Surgery | Admitting: Surgery

## 2017-09-03 ENCOUNTER — Other Ambulatory Visit: Payer: Self-pay

## 2017-09-03 HISTORY — DX: Abdominal aortic aneurysm, without rupture: I71.4

## 2017-09-03 HISTORY — DX: Gastro-esophageal reflux disease without esophagitis: K21.9

## 2017-09-03 HISTORY — DX: Abdominal aortic aneurysm, without rupture, unspecified: I71.40

## 2017-09-03 NOTE — Patient Instructions (Signed)
  Your procedure is scheduled on:  09-11-17 FRIDAY Report to Same Day Surgery 2nd floor medical mall [facility] Entrance-take elevator on left to 2nd floor.  Check in with surgery information desk.) To find out your arrival time please call [phone] between 1PM - 3PM on 09-10-17 THURSDAY  Remember: Instructions that are not followed completely may result in serious medical risk, up to and including death, or upon the discretion of your surgeon and anesthesiologist your surgery may need to be rescheduled.    _x___ 1. Do not eat food after midnight the night before your procedure. NO GUM OR CANDY AFTER MIDNIGHT.  You may drink clear liquids up to 2 hours before you are scheduled to arrive at the hospital for your procedure.  Do not drink clear liquids within 2 hours of your scheduled arrival to the hospital.  Clear liquids include  --Water or Apple juice without pulp  --Clear carbohydrate beverage such as ClearFast or Gatorade  --Black Coffee or Clear Tea (No milk, no creamers, do not add anything to the coffee or Tea    __x__ 2. No Alcohol for 24 hours before or after surgery.   __x__3. No Smoking for 24 prior to surgery.   ____  4. Bring all medications with you on the day of surgery if instructed.    __x__ 5. Notify your doctor if there is any change in your medical condition     (cold, fever, infections).     Do not wear jewelry, make-up, hairpins, clips or nail polish.  Do not wear lotions, powders, or perfumes. You may wear deodorant.  Do not shave 48 hours prior to surgery. Men may shave face and neck.  Do not bring valuables to the hospital.    [facility] is not responsible for any belongings or valuables.               Contacts, dentures or bridgework may not be worn into surgery.  Leave your suitcase in the car. After surgery it may be brought to your room.  For patients admitted to the hospital, discharge time is determined by your treatment team.   Patients  discharged the day of surgery will not be allowed to drive home.  You will need someone to drive you home and stay with you the night of your procedure.    Please read over the following fact sheets that you were given:   [facility] Preparing for Surgery and or MRSA Information   _x___ TAKE THE FOLLOWING MEDICATION THE MORNING OF SURGERY WITH A SMALL SIP OF WATER. These include:  1. CLONIDINE  2.  3.  4.  5.  6.  ____Fleets enema or Magnesium Citrate as directed.   _x___ Use CHG Soap or sage wipes as directed on instruction sheet   ____ Use inhalers on the day of surgery and bring to hospital day of surgery  ____ Stop Metformin and Janumet 2 days prior to surgery.    ____ Take 1/2 of usual insulin dose the night before surgery and none on the morning surgery.   _x___ Follow recommendations from Cardiologist, Pulmonologist or PCP regarding stopping Aspirin, Coumadin, Plavix ,Eliquis, Effient, or Pradaxa, and Pletal-ASK DR POGGI ABOUT STOPPING ASPIRIN  X____Stop Anti-inflammatories such as Advil, ALEVE, Ibuprofen, Motrin, Naproxen, LODINE, Naprosyn, Goodies powders or aspirin products NOW-OK to take Tylenol    ____ Stop supplements until after surgery.     ____ Bring C-Pap to the hospital.

## 2017-09-03 NOTE — Pre-Procedure Instructions (Signed)
Progress Notes - in this encounter  Cheryll Cockayne, MD - 07/24/2017 8:00 AM EDT Formatting of this note might be different from the original. Marc Weaver is seen for annual exam and hypertension, hyperlipidemia, arthritis, hyperglycemia, history of atrial myxoma, sleep apnea. Echocardiogram earlier this year showed no evidence of myxoma. He does have some mild to moderate mitral regurgitation, but was incidentally found to have abdominal aortic aneurysm. Abdominal ultrasound confirms 3.3 cm aneurysm, with repeat imaging recommended in 3 years. She continues to have diffuse joint pains periodically; this is primarily been in his shoulders more recently. He has used etodolac in the past with relief but is out of this. He is interested in restarting it. He is sleeping fairly well, though the pain sometimes disrupted slightly. His blood pressure has been controlled at home. He denies chest pain, shortness of breath, or any further cramping. He denies polyuria or polydipsia. Has not been quite as well with his diet.  Current Outpatient Medications  Medication Sig Dispense Refill  . aspirin 81 mg tablet 1 tab by mouth daily  . cloNIDine HCl (CATAPRES) 0.1 MG tablet Take 1 tablet (0.1 mg total) by mouth 2 (two) times daily 180 tablet 1  . clotrimazole-betamethasone (LOTRISONE) 1-0.05 % cream APPLY TOPICALLY 2 (TWO) TIMES DAILY. 45 g 1  . fexofenadine (ALLEGRA) 180 MG tablet Take 1 tablet (180 mg total) by mouth once daily 90 tablet 3  . losartan (COZAAR) 100 MG tablet Take 1 tablet (100 mg total) by mouth once daily 90 tablet 4  . atorvastatin (LIPITOR) 40 MG tablet Take 1 tablet (40 mg total) by mouth once daily 30 tablet 11  . etodolac (LODINE) 500 MG tablet Take 1 tablet (500 mg total) by mouth 2 (two) times daily 120 tablet 4  . traZODone (DESYREL) 50 MG tablet Take 1 tablet (50 mg total) by mouth nightly as needed for Sleep 90 tablet 4   No current facility-administered medications  for this visit.   Allergies as of 07/24/2017 - Reviewed 07/24/2017  Allergen Reaction Noted  . Neurontin [gabapentin] Other (See Comments) and Unknown 05/11/2014  . Other Hives 05/11/2014  . Yeast, dried Hives 05/11/2014   Past Medical History:  Diagnosis Date  . Atrial myxoma  . BPH (benign prostatic hypertrophy)  . DDD (degenerative disc disease)  MRI LS spine 2009 with DDD/spondylosis. MRI T spine 2013 unremarkable. MRI C spine 2013 with diffuse disc disease. Improved with physiatry  . Diverticulosis 10/03/2015  . GERD (gastroesophageal reflux disease) 10/03/2015  . Hives  evaluated by Dr. Donneta Romberg  . Hyperglycemia, unspecified 07/07/2014  . Hypertension  . Ischemia 10/03/2015  . Peyronie's disease  . Pure hypercholesterolemia  . Sleep apnea 12/14  mild by sleep study; did not require CPAP   Past Surgical History:  Procedure Laterality Date  . Atrial myxoma removed  . COLONOSCOPY 07/22/2013  06/24/2004. Heme-positive stool, diverticulosis, normal.  . COLONOSCOPY 10/03/2015  Ischemia/Diverticulosis/Repeat 46yr/PYO  . EGD 07/22/2013  heartburn, heme-positive stool, positive Barrett's  . EGD 10/03/2015  GERD/Negative for Barrett's/No Repeat/PYO  . KNEE ARTHROSCOPY Right 11/26/2012  right knee arthroscopy, partial medial meniscectomy and medial chondroplasty  . LAMINECTOMY LUMBAR SPINE 1988  L4, L5 and L6  . Medial compartment MAKOplasty Right 07/28/2013  . TONSILLECTOMY  with adenoidectomy   Health Maintenance Topics with due status: Overdue   Topic Date Due  Shingrix 03/16/2000   Family History  Problem Relation Age of Onset  . Cancer Mother 334 stomach cancer  .  Other Father  passed away from old age  . Heart defect Son  congenital   Social History   Socioeconomic History  . Marital status: Married  Spouse name: None  . Number of children: None  . Years of education: None  . Highest education level: None  Social Needs  . Financial resource strain:  None  . Food insecurity - worry: None  . Food insecurity - inability: None  . Transportation needs - medical: None  . Transportation needs - non-medical: None  Occupational History  . None  Tobacco Use  . Smoking status: Never Smoker  . Smokeless tobacco: Never Used  Substance and Sexual Activity  . Alcohol use: Yes  Alcohol/week: 0.0 oz  Comment: Rarely  . Drug use: No  . Sexual activity: None  Other Topics Concern  . None  Social History Narrative  . None   Goals Addressed      This Visit's Progress  . Lose Weight (pt-stated)  Patient states he would like to lose at least 20 pounds. He would like to eat healthier to help accomplish this goal.    Results for orders placed or performed in visit on 07/17/17  CBC w/auto Differential (5 Part)  Result Value Ref Range  WBC (White Blood Cell Count) 5.1 4.1 - 10.2 10^3/uL  RBC (Red Blood Cell Count) 4.89 4.69 - 6.13 10^6/uL  Hemoglobin 15.5 14.1 - 18.1 gm/dL  Hematocrit 46.6 40.0 - 52.0 %  MCV (Mean Corpuscular Volume) 95.3 80.0 - 100.0 fl  MCH (Mean Corpuscular Hemoglobin) 31.7 (H) 27.0 - 31.2 pg  MCHC (Mean Corpuscular Hemoglobin Concentration) 33.3 32.0 - 36.0 gm/dL  Platelet Count 180 150 - 450 10^3/uL  RDW-CV (Red Cell Distribution Width) 13.1 11.6 - 14.8 %  MPV (Mean Platelet Volume) 9.9 9.4 - 12.4 fl  Neutrophils 3.06 1.50 - 7.80 10^3/uL  Lymphocytes 1.32 1.00 - 3.60 10^3/uL  Monocytes 0.45 0.00 - 1.50 10^3/uL  Eosinophils 0.20 0.00 - 0.55 10^3/uL  Basophils 0.02 0.00 - 0.09 10^3/uL  Neutrophil % 60.4 32.0 - 70.0 %  Lymphocyte % 26.1 10.0 - 50.0 %  Monocyte % 8.9 4.0 - 13.0 %  Eosinophil % 4.0 1.0 - 5.0 %  Basophil% 0.4 0.0 - 2.0 %  Immature Granulocyte % 0.2 <=0.7 %  Immature Granulocyte Count 0.01 <=0.06 10^3/L  Comprehensive Metabolic Panel (CMP)  Result Value Ref Range  Glucose 115 (H) 70 - 110 mg/dL  Sodium 141 136 - 145 mmol/L  Potassium 4.3 3.6 - 5.1 mmol/L  Chloride 105 97 - 109 mmol/L  Carbon  Dioxide (CO2) 26.4 22.0 - 32.0 mmol/L  Urea Nitrogen (BUN) 22 7 - 25 mg/dL  Creatinine 1.0 0.7 - 1.3 mg/dL  Glomerular Filtration Rate (eGFR), MDRD Estimate 75 >60 mL/min/1.73sq m  Calcium 9.5 8.7 - 10.3 mg/dL  AST 21 8 - 39 U/L  ALT 28 6 - 57 U/L  Alk Phos (alkaline Phosphatase) 64 34 - 104 U/L  Albumin 4.5 3.5 - 4.8 g/dL  Bilirubin, Total 0.7 0.3 - 1.2 mg/dL  Protein, Total 6.6 6.1 - 7.9 g/dL  A/G Ratio 2.1 1.0 - 5.0 gm/dL  Hemoglobin A1C  Result Value Ref Range  Hemoglobin A1C 5.7 (H) 4.2 - 5.6 %  Average Blood Glucose (Calc) 117 mg/dL  Narrative  Normal Range: 4.2 - 5.6% Increased Risk: 5.7 - 6.4% Diabetes: >= 6.5% Glycemic Control for adults with diabetes: <7%  Lipid Panel w/calc LDL  Result Value Ref Range  Cholesterol, Total 177 100 -  200 mg/dL  Triglyceride 80 35 - 199 mg/dL  HDL (High Density Lipoprotein) Cholesterol 33.3 29.0 - 71.0 mg/dL  LDL (Low Density Lipoprotien), Calculated 128 0 - 130 mg/dL  VLDL Cholesterol 16 mg/dL  Cholesterol/HDL Ratio 5.3  Microalbumin, Random Urine  Result Value Ref Range  Urine Albumin, Random 27 mg/L  PSA, Total (Screen)  Result Value Ref Range  PSA (Prostate Specific Antigen), Total 0.45 0.10 - 4.00 ng/mL  Narrative  Test results were determined with Beckman Coulter Hybritech Assay. Values obtained with different assay methods cannot be used interchangeably in serial testing. Assay results should not be interpreted as absolute evidence of the presence or absence of malignant disease  Thyroid Stimulating-Hormone (TSH)  Result Value Ref Range  Thyroid Stimulating Hormone (TSH) 1.272 0.450-5.330 uIU/ml uIU/mL  Urinalysis w/Microscopic  Result Value Ref Range  Color Yellow Yellow, Straw, Blue  Clarity Clear Clear, Slightly Cloudy, Turbid Other  Specific Gravity <=1.005 1.000 - 1.030  pH, Urine 5.5 5.0 - 8.0  Protein, Urinalysis Negative Negative, Trace mg/dL  Glucose, Urinalysis Negative Negative mg/dL  Ketones, Urinalysis  Negative Negative mg/dL  Blood, Urinalysis Negative Negative  Nitrite, Urinalysis Negative Negative  Leukocyte Esterase, Urinalysis Negative Negative  White Blood Cells, Urinalysis None Seen None Seen, 0-3 /hpf  Red Blood Cells, Urinalysis None Seen None Seen, 0-3 /hpf  Bacteria, Urinalysis None Seen None Seen /hpf  Squamous Epithelial Cells, Urinalysis None Seen Rare, Few, None Seen /hpf   ROS: Please see HPI; no vision change; no dysphagia; no bowel or bladder concerns; remainder of complete 10 point ROS is negative  BP 135/83  Pulse 61  Ht 180.3 cm (5' 10.98")  Wt (!) 106.1 kg (234 lb)  SpO2 97%  BMI 32.65 kg/m   GENERAL: Well developed, well nourished male, in no significant distress.  HEENT: Pupils round and equal. Oropharynx moist without lesions. Extraocular motion intact. TMs clear NECK: Supple, trachea midline. No thyromegaly. CHEST: Normal to palpation. CARDIAC: Regular rate and rhythm without murmurs, gallops or rubs.  VASCULAR: Carotid and radial pulses are 2+. Distal pulses 2+. LUNGS: Clear bilaterally without wheezing or retractions. ABDOMEN: Soft, nontender, positive bowel sounds. No guard or rebound.  GENITALIA: normal male without testicular masses or tenderness RECTAL: Normal tone. Prostate enlarged without nodules or asymmetry.  EXTREMITIES: No clubbing or cyanosis. Varicosities, nontender. Bilateral venous stasis changes. Trace ankle edema.  NEUROLOGIC: Cranial nerves grossly intact. Motor and sensory exams appear intact and symmetrical, with some pain with abduction on the shoulders bilaterally DERMATOLOGIC: No significant rashes or nodules  LYMPH: No cervical or supraclavicular lymphadenopathy.   Essential hypertension (primary encounter diagnosis) Moderate mitral regurgitation Abdominal aortic aneurysm (AAA) without rupture (CMS-HCC) Obstructive sleep apnea syndrome DDD (degenerative disc disease), lumbosacral History of atrial myxoma Pure  hypercholesterolemia Hyperglycemia, unspecified  Assessment and Plan  1. Hypertension-this remains adequately controlled. Limit salt; encouraged diet, exercise. Follow met b, u/a, tsh periodically  2. Hyperglycemia. Continue efforts with weight loss. Following glucose and A1c.  3. Hyperlipidemia-given the finding of aneurysm, statin therapy is recommended. Starting Lipitor 40 mg at bedtime, with side effects reviewed. Follow liver/lipids periodically  4. BPH. He remains asymptomatic. Monitor PSA periodically.  5. Abdominal aortic aneurysm. 3.3 cm by ultrasound earlier this year. Recommendation is to repeat imaging in 3 years. Continue efforts at control of blood pressure and cholesterol. Discussed with patient 6. Sleep apnea. Continue to encourage lifestyle efforts to bring weight down. Symptoms stable. 7. Osteoarthritis. Discussed options; he is tolerated the total lack  and we will restart this; he is to separate from the aspirin and we discussed side effects. Watch CBC periodically 8. History of atrial myxoma. Echo earlier this year shows no evidence of recurrence 9. Health maintenance-discussed Shingrix. Follow-up in 6 months, returning sooner if needed. Check cholesterol and liver in 1 month with starting Lipitor.  Los Banos III, MD  Portions of this note were created using dictation software and may contain typographical errors.   Electronically Signed by Cheryll Cockayne, MD on 07/24/2017 8:00 AM EDT     Plan of Treatment - as of this encounter  Upcoming Encounters Upcoming Encounters  Date Type Specialty Care Team Description  09/16/2017 PT/OT Office Visit Physical Therapy Debbra Riding, PT  [place], [place] [address]  [phone]  [phone] (Fax)    09/23/2017 Post Op Orthopaedics Poggi, Smith Mince, MD  [place]  [facility] [place], [place] [address]  [phone]  [phone] (Fax)    10/21/2017  Post Op Orthopaedics Poggi, Smith Mince, MD  [place]  [facility] [place], [place] [address]  [phone]  [phone] (Fax)    01/15/2018 Ancillary Orders Lab Cheryll Cockayne, MD  [address]  [facility] [place], [place] [address]  [phone]  [phone]    01/22/2018 Office Visit Internal Medicine Cheryll Cockayne, [place] [facility]  [facility] [place], [place] [address]  [phone]  [phone] (Fax)     Scheduled Tests Scheduled Tests  Name Priority Associated Diagnoses Order Schedule  CBC w/auto Differential (5 Part) Routine Abdominal aortic aneurysm (AAA) without rupture (CMS-HCC)  Expected: 01/10/2018 (Approximate), Expires: 07/25/2018  Comprehensive Metabolic Panel (CMP) Routine Essential hypertension  Abdominal aortic aneurysm (AAA) without rupture (CMS-HCC)  Pure hypercholesterolemia  Hyperglycemia, unspecified  Expected: 01/10/2018 (Approximate), Expires: 07/25/2018  Hemoglobin A1C Routine Hyperglycemia, unspecified  Expected: 01/10/2018 (Approximate), Expires: 07/25/2018  Lipid Panel w/calc LDL Routine Abdominal aortic aneurysm (AAA) without rupture (CMS-HCC)  Pure hypercholesterolemia  Expected: 01/10/2018 (Approximate), Expires: 07/25/2018  Microalbumin, Random Urine Routine Essential hypertension  Expected: 12/31/2017 (Approximate), Expires: 07/25/2018  Urinalysis w/Microscopic Routine Essential hypertension  Expected: 01/10/2018 (Approximate), Expires: 07/25/2018  Thyroid Stimulating-Hormone (TSH) Routine Pure hypercholesterolemia  Expected: 01/22/2018 (Approximate), Expires: 07/25/2018   Goals - as of this encounter  Goal Patient Goal Type Associated Problems Recent Progress Patient-Stated? Author  Lose Weight  Weight   Yes Bernarda Caffey, CMA  Note:   Patient states he would like to lose at least 20 pounds. He would like to eat healthier to help  accomplish this goal.  Electronically Signed by Bernarda Caffey, CMA on 07/24/2017 8:03 AM EDT    Lab Results - in this encounter  Table of Contents for Lab Results  Hepatic Function Panel (HFP) (08/25/2017 8:14 AM EST)  Lipid Panel w/calc LDL (08/25/2017 8:14 AM EST)     Hepatic Function Panel (HFP) (08/25/2017 8:14 AM EST) Hepatic Function Panel (HFP) (08/25/2017 8:14 AM EST)  Component Value Ref Range Performed At Pathologist Signature  Protein, Total 6.7 6.1 - 7.9 g/dL [facility] - LAB   Albumin 4.6 3.5 - 4.8 g/dL [facility] - [facility]   Bilirubin, Total 0.9 0.3 - 1.2 mg/dL [place] - LAB   Bilirubin, Conjugated 0.20 0.00 - 0.20 mg/dL [facility] - [facility]   Alk Phos (alkaline Phosphatase) 75 34 - 104 U/L [facility] - LAB  AST  31 8 - 39 U/L [facility] - [facility]   ALT  41 6 - 57 U/L [facility] - LAB    Hepatic Function Panel (HFP) (08/25/2017 8:14 AM EST)  Specimen  Blood   Hepatic Function Panel (HFP) (08/25/2017 8:14 AM EST)  Performing Organization Address City/State/Zipcode Phone Number  [place]  [address], [place] [address]    Back to top of Lab Results    Lipid Panel w/calc LDL (08/25/2017 8:14 AM EST) Lipid Panel w/calc LDL (08/25/2017 8:14 AM EST)  Component Value Ref Range Performed At Pathologist Signature  Cholesterol, Total 137 100 - 200 mg/dL [facility]   Triglyceride 100 35 - 199 mg/dL [facility]   HDL (High Density Lipoprotein) Cholesterol 39.9 29.0 - 71.0 mg/dL [facility]   LDL (Low Density Lipoprotien), Calculated 77 0 - 130 mg/dL [facility]   VLDL Cholesterol 20 mg/dL [facility]   Cholesterol/HDL Ratio 3.4  [facility] - LAB    Lipid Panel w/calc LDL (08/25/2017 8:14 AM EST)  Specimen  Blood   Lipid Panel w/calc LDL (08/25/2017 8:14 AM EST)    Performing Organization Address City/State/Zipcode Phone Number  [place]  [place], [place] [address]    Back to top of Lab Results   Visit Diagnoses - in this encounter  Diagnosis  Essential hypertension - Primary   Moderate mitral regurgitation   Abdominal aortic aneurysm (AAA) without rupture (CMS-HCC)   Obstructive sleep apnea syndrome  Obstructive sleep apnea (adult) (pediatric)   DDD (degenerative disc disease), lumbosacral  Degeneration of lumbar or lumbosacral intervertebral disc   History of atrial myxoma   Pure hypercholesterolemia   Hyperglycemia, unspecified  Other abnormal glucose    Discontinued Medications - as of this encounter  Medication Sig Discontinue Reason Start Date End Date  cloNIDine HCl (CATAPRES) 0.1 MG tablet  Take 1 tablet (0.1 mg total) by mouth 2 (two) times daily. Reorder 06/10/2017 07/24/2017  fexofenadine (ALLEGRA) 180 MG tablet  Take 1 tablet (180 mg total) by mouth once daily. Reorder 02/26/2017 07/24/2017  losartan (COZAAR) 100 MG tablet  Take 1 tablet (100 mg total) by mouth once daily. Reorder 05/04/2017 07/24/2017  traZODone (DESYREL) 50 MG tablet  Take 1 tablet (50 mg total) by mouth nightly as needed for Sleep. Reorder 07/17/2016 07/24/2017   Images Document Information  Primary Care Provider Other Service Providers Document Coverage Dates  Sheliah Hatch MD (Apr. 07, 2015 - Present) [phone] (Work) [phone] (Fax) [facility] [place], [place] [address]   Oct. 26, 2018   [place] [address] [place], [place] [address]   Encounter Providers Encounter Date  Sheliah Hatch MD (Attending) [phone] (Work) [phone] (Fax) [facility] [place], [place] [address]  Oct. 26, 2018

## 2017-09-03 NOTE — Pre-Procedure Instructions (Signed)
Echo complete6/12/2016 Marc Weaver Component Name Value Ref Range  LV Ejection Fraction (%) 50   Aortic Valve Regurgitation Grade none   Aortic Valve Stenosis Grade none   Aortic Valve Max Velocity (m/s) 1.3 m/sec  Aortic Valve Stenosis Mean Gradient (mmHg) 4.0 mmHg  Mitral Valve Regurgitation Grade mild   Mitral Valve Stenosis Grade none   Tricuspid Valve Regurgitation Grade mild   Tricuspid Valve Regurgitation Max Velocity (m/s) 2.4 m/sec  Right Ventricle Systolic Pressure (mmHg) 02.7 mmHg  LV End Diastolic Diameter (cm) 5.3 cm  LV End Systolic Diameter (cm) 3.9 cm  LV Septum Wall Thickness (cm) 1.2 cm  LV Posterior Wall Thickness (cm) 1.0 cm  Left Atrium Diameter (cm) 3.7 cm  Result Narrative  CARDIOLOGY DEPARTMENT ABRAM, SAX [id number] [facility]: [id number] [address] Marc Weaver, [place]: 02/26/2017 09:09 AM Adult Male Age: 58 yrs ECHOCARDIOGRAM REPORT Outpatient KC::KCWC  STUDY:CHEST WALL TAPE:0000:00: 0:00:00 MD1:KLEIN, BERT JACK ECHO:Yes DOPPLER:YesFILE:0000-000-000 BP: 138/87 mmHg  COLOR:YesCONTRAST:No MACHINE:Philips Height: 70 in  RV BIOPSY:No 3D:NoSOUND QLTY:ModerateWeight: 230 lb MEDIUM:None BSA: 2.2 m2  ___________________________________________________________________________________________  HISTORY:Cardiac tumor removal REASON:Assess, LV function 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 IVC:nm* [ <= 2.1]  FS:26.3 %[> 25]Min. IVC:nm* SWT:1.2 cm[0.6 - 1.0] ------------------ PWT:1.0 cm[0.6 - 1.0] nm* - not measured  LEFT ATRIUM LA Diam:3.7 cm[3.0 - 4.0] LA A4C Area:nm* [ < 20] LA Volume:nm* Roice.Felt - 58] ___________________________________________________________________________________________  ECHOCARDIOGRAPHIC DESCRIPTIONS  AORTIC ROOT Size:Normal Dissection:INDETERM FOR DISSECTION  AORTIC VALVE Leaflets:TricuspidMorphology:Normal Mobility:Fully mobile  LEFT VENTRICLE Size:Normal Anterior:Normal  Contraction:NormalLateral:Normal Closest EF:50% (Estimated)Septal:Normal  LV Masses:No MassesApical:Normal  QVZ:DGLO Inferior:Normal  Posterior:Normal Dias.FxClass:(Grade 1) relaxation abnormal, E/A reversal  MITRAL VALVE Leaflets:Normal Mobility:Fully  mobile Morphology:Normal  LEFT ATRIUM Size:MILDLY ENLARGED LA Masses:No masses  IA Septum:Normal IAS  MAIN PA Size:Normal  PULMONIC VALVE Morphology:Normal Mobility:Fully mobile  RIGHT VENTRICLE Size:NormalFree Wall:Normal  Contraction:NormalRV Masses:No Masses  TRICUSPID VALVE Leaflets:Normal Mobility:Fully mobile Morphology:Normal  RIGHT ATRIUM Size:MILDLY ENLARGEDRA Other:None  RA Mass:No masses  PERICARDIUM  Fluid:No effusion  INFERIOR VENACAVA Size:Normal Normal respiratory collapse  ___________________________________________________________________  DOPPLER ECHO and OTHER SPECIAL PROCEDURES  Aortic:No AR No AS 132.0 cm/sec peak vel 7.0 mmHg peak grad 4.0 mmHg mean grad2.3 cm^2 by DOPPLER   Mitral:MILD MR No MS 4.9 cm^2 by DOPPLER MV Inflow E Vel=72.1 cm/sec MV Annulus E'Vel=nm* E/E'Ratio=nm*  Tricuspid:MILD TR No TS 239.0 cm/sec peak TR vel27.8 mmHg peak RV pressure  Pulmonary:TRIVIAL PRNo PS    ___________________________________________________________________________________________  INTERPRETATION NORMAL LEFT VENTRICULAR SYSTOLIC FUNCTION WITH AN ESTIMATED EF = 50-55 % NORMAL RIGHT VENTRICULAR SYSTOLIC FUNCTION MILD-TO-MODERATE MITRAL VALVE INSUFFICIENCY MILD TRICUSPID VALVE INSUFFICIENCY NO VALVULAR STENOSIS MILD BIATRIAL ENLARGEMENT NO ATRIAL MYXOMA OR OTHER MASSES VISUALIZED  INCIDENTAL FINDING:  SMALL DISTAL ABDOMINAL AORTIC ANEURYSM (SEE IMAGES [id number]) MEASURING UP TO 3.2 cm AP x 3.2 cm TRV  ___________________________________________________________________________________________  Electronically  signed by: MD Serafina Royals on 03/02/2017 10:15 AM Performed By: Johnathan Hausen, RDCS, RVT Ordering Physician: Ramonita Lab ___________________________________________________________________________________________  Other Result Information  Interface, Text Results In - 03/02/2017 10:15 AM EDT  CARDIOLOGY DEPARTMENT                   Starnes, Marc Weaver                      [id number]                   [facility]                  Acct #: [id number]         [address] [name], [place] [address]        Date: 02/26/2017 09:09 AM                                                             Adult Male Age: 66 yrs                     ECHOCARDIOGRAM REPORT                   Outpatient                                                             KC::KCWC          STUDY:CHEST WALL         TAPE:0000:00: 0:00:00     MD1:  Sheliah Hatch           ECHO:Yes   DOPPLER:Yes  FILE:0000-000-000         BP: 138/87 mmHg          COLOR:Yes  CONTRAST:No       MACHINE:Philips       Height: 70 in      RV BIOPSY:No         3D:No    SOUND QLTY:Moderate      Weight: 230 lb         MEDIUM:None                                         BSA: 2.2 m2  ___________________________________________________________________________________________        HISTORY:Cardiac tumor removal         REASON:Assess, LV function     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, 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 ___________________________________________________________________________________________  ECHOCARDIOGRAPHIC MEASUREMENTS 2D DIMENSIONS AORTA             Values      Normal Range      MAIN PA          Values      Normal Range           Annulus:  nm*       [2.3 - 2.9]                PA Main:  nm*       [  1.5 - 2.1]         Aorta Sin:  nm*       [3.1 - 3.7]       RIGHT VENTRICLE       ST Junction:  nm*       [2.6 - 3.2]                RV Base:  nm*        [ < 4.2]         Asc.Aorta:  nm*       [2.6 - 3.4]                 RV Mid:  nm*       [ < 3.5]  LEFT VENTRICLE                                        RV Length:  nm*       [ < 8.6]             LVIDd:  5.3 cm    [4.2 - 5.9]       INFERIOR VENA CAVA             LVIDs:  3.9 cm                              Max. IVC:  nm*       [ <= 2.1]                FS:  26.3 %    [> 25]                    Min. IVC:  nm*               SWT:  1.2 cm    [0.6 - 1.0]                   ------------------               PWT:  1.0 cm    [0.6 - 1.0]                   nm* - not measured  LEFT ATRIUM           LA Diam:  3.7 cm    [3.0 - 4.0]       LA A4C Area:  nm*       [ < 20]         LA Volume:  nm*       [18 - 58] ___________________________________________________________________________________________  ECHOCARDIOGRAPHIC DESCRIPTIONS  AORTIC ROOT         Size:Normal   Dissection:INDETERM FOR DISSECTION  AORTIC VALVE     Leaflets:Tricuspid          Morphology:Normal     Mobility:Fully mobile  LEFT VENTRICLE         Size:Normal               Anterior:Normal  Contraction:Normal                Lateral:Normal   Closest EF:50% (Estimated)        Septal:Normal    LV Masses:No Masses              Apical:Normal  QTM:AUQJ                 Inferior:Normal                                  Posterior:Normal Willette Brace.FxClass:(Grade 1) relaxation abnormal, E/A reversal  MITRAL VALVE     Leaflets:Normal               Mobility:Fully mobile   Morphology:Normal  LEFT ATRIUM         Size:MILDLY ENLARGED     LA Masses:No masses    IA Septum:Normal IAS  MAIN PA         Size:Normal  PULMONIC VALVE   Morphology:Normal               Mobility:Fully mobile  RIGHT VENTRICLE         Size:Normal              Free Wall:Normal  Contraction:Normal              RV Masses:No Masses  TRICUSPID VALVE     Leaflets:Normal               Mobility:Fully mobile   Morphology:Normal  RIGHT ATRIUM         Size:MILDLY ENLARGED       RA Other:None      RA Mass:No masses  PERICARDIUM        Fluid:No effusion  INFERIOR VENACAVA         Size:Normal Normal respiratory collapse  ___________________________________________________________________  DOPPLER ECHO and OTHER SPECIAL PROCEDURES    Aortic:No AR                         No AS           132.0 cm/sec peak vel         7.0 mmHg peak grad           4.0 mmHg mean grad            2.3 cm^2 by DOPPLER     Mitral:MILD MR                       No MS                                         4.9 cm^2 by DOPPLER           MV Inflow E Vel=72.1 cm/sec   MV Annulus E'Vel=nm*           E/E'Ratio=nm*  Tricuspid:MILD TR                       No TS           239.0 cm/sec peak TR vel      27.8 mmHg peak RV pressure  Pulmonary:TRIVIAL PR                    No PS    ___________________________________________________________________________________________  INTERPRETATION NORMAL LEFT VENTRICULAR SYSTOLIC FUNCTION WITH AN ESTIMATED EF = 50-55 % NORMAL RIGHT VENTRICULAR SYSTOLIC FUNCTION MILD-TO-MODERATE MITRAL VALVE INSUFFICIENCY MILD TRICUSPID VALVE INSUFFICIENCY NO VALVULAR STENOSIS MILD BIATRIAL ENLARGEMENT NO ATRIAL MYXOMA OR OTHER MASSES VISUALIZED  INCIDENTAL FINDING:  SMALL DISTAL ABDOMINAL AORTIC  ANEURYSM (SEE IMAGES [id number]) MEASURING UP TO 3.2 cm AP x 3.2 cm TRV  ___________________________________________________________________________________________  Electronically signed by: MD Serafina Royals on 03/02/2017 10:15 AM             Performed By: Johnathan Hausen, RDCS, RVT       Ordering Physician: Ramonita Lab ___________________________________________________________________________________________  Status Results Details   Unavailable

## 2017-09-04 ENCOUNTER — Inpatient Hospital Stay: Admission: RE | Admit: 2017-09-04 | Payer: 59 | Source: Ambulatory Visit

## 2017-09-07 ENCOUNTER — Encounter
Admission: RE | Admit: 2017-09-07 | Discharge: 2017-09-07 | Disposition: A | Discharge status: Home / Self Care | Payer: 59 | Source: Ambulatory Visit | Attending: Surgery | Admitting: Surgery

## 2017-09-07 DIAGNOSIS — I10 Essential (primary) hypertension: Secondary | ICD-10-CM | POA: Diagnosis present

## 2017-09-07 DIAGNOSIS — I444 Left anterior fascicular block: Secondary | ICD-10-CM | POA: Diagnosis not present

## 2017-09-10 MED ORDER — CEFAZOLIN SODIUM-DEXTROSE 2-4 GM/100ML-% IV SOLN
2.0000 g | Freq: Once | INTRAVENOUS | Status: AC
  Administered 2017-09-11: 2 g via INTRAVENOUS

## 2017-09-11 ENCOUNTER — Ambulatory Visit: Payer: 59 | Admitting: Certified Registered Nurse Anesthetist

## 2017-09-11 ENCOUNTER — Encounter
Admission: RE | Disposition: A | Discharge status: Home / Self Care | Payer: Self-pay | Source: Ambulatory Visit | Attending: Surgery

## 2017-09-11 ENCOUNTER — Ambulatory Visit
Admission: RE | Admit: 2017-09-11 | Discharge: 2017-09-11 | Disposition: A | Discharge status: Home / Self Care | Payer: 59 | Source: Ambulatory Visit | Attending: Surgery | Admitting: Surgery

## 2017-09-11 DIAGNOSIS — I10 Essential (primary) hypertension: Secondary | ICD-10-CM | POA: Diagnosis not present

## 2017-09-11 DIAGNOSIS — Z7982 Long term (current) use of aspirin: Secondary | ICD-10-CM | POA: Diagnosis not present

## 2017-09-11 DIAGNOSIS — M75122 Complete rotator cuff tear or rupture of left shoulder, not specified as traumatic: Secondary | ICD-10-CM | POA: Insufficient documentation

## 2017-09-11 DIAGNOSIS — M7542 Impingement syndrome of left shoulder: Secondary | ICD-10-CM | POA: Insufficient documentation

## 2017-09-11 DIAGNOSIS — E7800 Pure hypercholesterolemia, unspecified: Secondary | ICD-10-CM | POA: Diagnosis not present

## 2017-09-11 DIAGNOSIS — I714 Abdominal aortic aneurysm, without rupture: Secondary | ICD-10-CM | POA: Diagnosis not present

## 2017-09-11 DIAGNOSIS — Z888 Allergy status to other drugs, medicaments and biological substances status: Secondary | ICD-10-CM | POA: Diagnosis not present

## 2017-09-11 DIAGNOSIS — K219 Gastro-esophageal reflux disease without esophagitis: Secondary | ICD-10-CM | POA: Diagnosis not present

## 2017-09-11 DIAGNOSIS — I739 Peripheral vascular disease, unspecified: Secondary | ICD-10-CM | POA: Insufficient documentation

## 2017-09-11 DIAGNOSIS — G4730 Sleep apnea, unspecified: Secondary | ICD-10-CM | POA: Diagnosis not present

## 2017-09-11 DIAGNOSIS — Z79899 Other long term (current) drug therapy: Secondary | ICD-10-CM | POA: Insufficient documentation

## 2017-09-11 DIAGNOSIS — M47819 Spondylosis without myelopathy or radiculopathy, site unspecified: Secondary | ICD-10-CM | POA: Insufficient documentation

## 2017-09-11 DIAGNOSIS — N400 Enlarged prostate without lower urinary tract symptoms: Secondary | ICD-10-CM | POA: Diagnosis not present

## 2017-09-11 DIAGNOSIS — M7522 Bicipital tendinitis, left shoulder: Secondary | ICD-10-CM | POA: Insufficient documentation

## 2017-09-11 DIAGNOSIS — S46102A Unspecified injury of muscle, fascia and tendon of long head of biceps, left arm, initial encounter: Secondary | ICD-10-CM | POA: Insufficient documentation

## 2017-09-11 DIAGNOSIS — M170 Bilateral primary osteoarthritis of knee: Secondary | ICD-10-CM | POA: Diagnosis not present

## 2017-09-11 HISTORY — PX: SHOULDER ARTHROSCOPY WITH OPEN ROTATOR CUFF REPAIR: SHX6092

## 2017-09-11 SURGERY — ARTHROSCOPY, SHOULDER WITH REPAIR, ROTATOR CUFF, OPEN
Anesthesia: General | Site: Shoulder | Laterality: Left | Wound class: Clean

## 2017-09-11 MED ORDER — FENTANYL CITRATE (PF) 100 MCG/2ML IJ SOLN
INTRAMUSCULAR | Status: DC | PRN
  Administered 2017-09-11 (×2): 50 ug via INTRAVENOUS

## 2017-09-11 MED ORDER — SUGAMMADEX SODIUM 200 MG/2ML IV SOLN
INTRAVENOUS | Status: DC | PRN
  Administered 2017-09-11: 200 mg via INTRAVENOUS

## 2017-09-11 MED ORDER — ONDANSETRON HCL 4 MG PO TABS: 4.0000 mg | ORAL_TABLET | Freq: Four times a day (QID) | ORAL | Status: DC | PRN

## 2017-09-11 MED ORDER — ROPIVACAINE HCL 5 MG/ML IJ SOLN
INTRAMUSCULAR | Status: AC
  Filled 2017-09-11: qty 30

## 2017-09-11 MED ORDER — PROPOFOL 10 MG/ML IV BOLUS
INTRAVENOUS | Status: AC
  Filled 2017-09-11: qty 20

## 2017-09-11 MED ORDER — ONDANSETRON HCL 4 MG/2ML IJ SOLN
INTRAMUSCULAR | Status: DC | PRN
  Administered 2017-09-11: 4 mg via INTRAVENOUS

## 2017-09-11 MED ORDER — OXYCODONE HCL 5 MG PO TABS: 5.0000 mg | ORAL_TABLET | ORAL | 0 refills | Status: DC | PRN

## 2017-09-11 MED ORDER — FENTANYL CITRATE (PF) 100 MCG/2ML IJ SOLN
INTRAMUSCULAR | Status: AC
  Filled 2017-09-11: qty 2

## 2017-09-11 MED ORDER — ONDANSETRON HCL 4 MG/2ML IJ SOLN
INTRAMUSCULAR | Status: AC
  Filled 2017-09-11: qty 2

## 2017-09-11 MED ORDER — EPHEDRINE SULFATE 50 MG/ML IJ SOLN
INTRAMUSCULAR | Status: DC | PRN
  Administered 2017-09-11: 15 mg via INTRAVENOUS
  Administered 2017-09-11: 10 mg via INTRAVENOUS

## 2017-09-11 MED ORDER — ROCURONIUM BROMIDE 50 MG/5ML IV SOLN
INTRAVENOUS | Status: AC
  Filled 2017-09-11: qty 1

## 2017-09-11 MED ORDER — BUPIVACAINE-EPINEPHRINE 0.5% -1:200000 IJ SOLN
INTRAMUSCULAR | Status: DC | PRN
  Administered 2017-09-11: 30 mL

## 2017-09-11 MED ORDER — METOCLOPRAMIDE HCL 10 MG PO TABS: 5.0000 mg | ORAL_TABLET | Freq: Three times a day (TID) | ORAL | Status: DC | PRN

## 2017-09-11 MED ORDER — SUGAMMADEX SODIUM 200 MG/2ML IV SOLN
INTRAVENOUS | Status: AC
  Filled 2017-09-11: qty 2

## 2017-09-11 MED ORDER — EPINEPHRINE PF 1 MG/ML IJ SOLN
INTRAMUSCULAR | Status: AC
  Filled 2017-09-11: qty 2

## 2017-09-11 MED ORDER — FAMOTIDINE 20 MG PO TABS
20.0000 mg | ORAL_TABLET | Freq: Once | ORAL | Status: AC
  Administered 2017-09-11: 20 mg via ORAL

## 2017-09-11 MED ORDER — BUPIVACAINE-EPINEPHRINE (PF) 0.5% -1:200000 IJ SOLN
INTRAMUSCULAR | Status: AC
  Filled 2017-09-11: qty 30

## 2017-09-11 MED ORDER — LIDOCAINE HCL (PF) 1 % IJ SOLN
INTRAMUSCULAR | Status: AC
  Filled 2017-09-11: qty 5

## 2017-09-11 MED ORDER — POTASSIUM CHLORIDE IN NACL 20-0.9 MEQ/L-% IV SOLN: INTRAVENOUS | Status: DC

## 2017-09-11 MED ORDER — ROCURONIUM BROMIDE 100 MG/10ML IV SOLN
INTRAVENOUS | Status: DC | PRN
  Administered 2017-09-11: 10 mg via INTRAVENOUS
  Administered 2017-09-11: 50 mg via INTRAVENOUS
  Administered 2017-09-11: 10 mg via INTRAVENOUS

## 2017-09-11 MED ORDER — FAMOTIDINE 20 MG PO TABS
ORAL_TABLET | ORAL | Status: AC
  Administered 2017-09-11: 20 mg via ORAL
  Filled 2017-09-11: qty 1

## 2017-09-11 MED ORDER — FENTANYL CITRATE (PF) 100 MCG/2ML IJ SOLN: 25.0000 ug | INTRAMUSCULAR | Status: DC | PRN

## 2017-09-11 MED ORDER — MIDAZOLAM HCL 2 MG/2ML IJ SOLN
2.0000 mg | Freq: Once | INTRAMUSCULAR | Status: AC
  Administered 2017-09-11: 2 mg via INTRAVENOUS

## 2017-09-11 MED ORDER — LIDOCAINE HCL (CARDIAC) 20 MG/ML IV SOLN
INTRAVENOUS | Status: DC | PRN
  Administered 2017-09-11: 80 mg via INTRAVENOUS

## 2017-09-11 MED ORDER — DEXAMETHASONE SODIUM PHOSPHATE 10 MG/ML IJ SOLN
INTRAMUSCULAR | Status: AC
  Filled 2017-09-11: qty 1

## 2017-09-11 MED ORDER — MIDAZOLAM HCL 2 MG/2ML IJ SOLN
INTRAMUSCULAR | Status: AC
  Filled 2017-09-11: qty 2

## 2017-09-11 MED ORDER — CEFAZOLIN SODIUM-DEXTROSE 2-4 GM/100ML-% IV SOLN
INTRAVENOUS | Status: AC
  Filled 2017-09-11: qty 100

## 2017-09-11 MED ORDER — OXYCODONE HCL 5 MG PO TABS: 5.0000 mg | ORAL_TABLET | ORAL | Status: DC | PRN

## 2017-09-11 MED ORDER — BUPIVACAINE LIPOSOME 1.3 % IJ SUSP
INTRAMUSCULAR | Status: AC
  Filled 2017-09-11: qty 20

## 2017-09-11 MED ORDER — KETOROLAC TROMETHAMINE 30 MG/ML IJ SOLN
INTRAMUSCULAR | Status: DC | PRN
  Administered 2017-09-11: 30 mg via INTRAVENOUS

## 2017-09-11 MED ORDER — DEXAMETHASONE SODIUM PHOSPHATE 10 MG/ML IJ SOLN
INTRAMUSCULAR | Status: DC | PRN
  Administered 2017-09-11: 10 mg via INTRAVENOUS

## 2017-09-11 MED ORDER — LIDOCAINE HCL (PF) 2 % IJ SOLN
INTRAMUSCULAR | Status: AC
  Filled 2017-09-11: qty 10

## 2017-09-11 MED ORDER — MIDAZOLAM HCL 2 MG/2ML IJ SOLN
INTRAMUSCULAR | Status: AC
  Administered 2017-09-11: 2 mg via INTRAVENOUS
  Filled 2017-09-11: qty 2

## 2017-09-11 MED ORDER — METOCLOPRAMIDE HCL 5 MG/ML IJ SOLN: 5.0000 mg | Freq: Three times a day (TID) | INTRAMUSCULAR | Status: DC | PRN

## 2017-09-11 MED ORDER — BUPIVACAINE HCL (PF) 0.5 % IJ SOLN
INTRAMUSCULAR | Status: AC
  Filled 2017-09-11: qty 10

## 2017-09-11 MED ORDER — LACTATED RINGERS IV SOLN
INTRAVENOUS | Status: DC
  Administered 2017-09-11: 1000 mL via INTRAVENOUS

## 2017-09-11 MED ORDER — LACTATED RINGERS IV SOLN
INTRAVENOUS | Status: DC | PRN
  Administered 2017-09-11: 2 mL

## 2017-09-11 MED ORDER — PHENYLEPHRINE HCL 10 MG/ML IJ SOLN
INTRAMUSCULAR | Status: DC | PRN
  Administered 2017-09-11 (×3): 100 ug via INTRAVENOUS

## 2017-09-11 MED ORDER — ONDANSETRON HCL 4 MG/2ML IJ SOLN: 4.0000 mg | Freq: Four times a day (QID) | INTRAMUSCULAR | Status: DC | PRN

## 2017-09-11 MED ORDER — PROPOFOL 10 MG/ML IV BOLUS
INTRAVENOUS | Status: DC | PRN
  Administered 2017-09-11: 200 mg via INTRAVENOUS

## 2017-09-11 MED ORDER — KETOROLAC TROMETHAMINE 30 MG/ML IJ SOLN
INTRAMUSCULAR | Status: AC
  Filled 2017-09-11: qty 1

## 2017-09-11 MED ORDER — MIDAZOLAM HCL 2 MG/2ML IJ SOLN
INTRAMUSCULAR | Status: DC | PRN
  Administered 2017-09-11: 2 mg via INTRAVENOUS

## 2017-09-11 MED ORDER — ONDANSETRON HCL 4 MG/2ML IJ SOLN: 4.0000 mg | Freq: Once | INTRAMUSCULAR | Status: DC | PRN

## 2017-09-11 SURGICAL SUPPLY — 54 items
ANCHOR JUGGERKNOT WTAP NDL 2.9 (Anchor) ×15 IMPLANT
ANCHOR SUT QUATTRO KNTLS 4.5 (Anchor) ×6 IMPLANT
BIT DRILL JUGRKNT W/NDL BIT2.9 (DRILL) ×1 IMPLANT
BLADE FULL RADIUS 3.5 (BLADE) ×3 IMPLANT
BUR ACROMIONIZER 4.0 (BURR) ×3 IMPLANT
CANNULA SHAVER 8MMX76MM (CANNULA) ×3 IMPLANT
CHLORAPREP W/TINT 26ML (MISCELLANEOUS) ×3 IMPLANT
COOLER POLAR GLACIER W/PUMP (MISCELLANEOUS) ×3 IMPLANT
COVER MAYO STAND STRL (DRAPES) ×3 IMPLANT
DRAPE IMP U-DRAPE 54X76 (DRAPES) ×6 IMPLANT
DRILL JUGGERKNOT W/NDL BIT 2.9 (DRILL) ×3
DRSG OPSITE POSTOP 4X8 (GAUZE/BANDAGES/DRESSINGS) IMPLANT
ELECT REM PT RETURN 9FT ADLT (ELECTROSURGICAL) ×3
ELECTRODE REM PT RTRN 9FT ADLT (ELECTROSURGICAL) ×1 IMPLANT
GAUZE PETRO XEROFOAM 1X8 (MISCELLANEOUS) ×3 IMPLANT
GAUZE SPONGE 4X4 12PLY STRL (GAUZE/BANDAGES/DRESSINGS) ×3 IMPLANT
GLOVE BIO SURGEON STRL SZ7.5 (GLOVE) ×6 IMPLANT
GLOVE BIO SURGEON STRL SZ8 (GLOVE) ×6 IMPLANT
GLOVE BIOGEL PI IND STRL 7.0 (GLOVE) ×4 IMPLANT
GLOVE BIOGEL PI IND STRL 8 (GLOVE) ×1 IMPLANT
GLOVE BIOGEL PI INDICATOR 7.0 (GLOVE) ×8
GLOVE BIOGEL PI INDICATOR 8 (GLOVE) ×2
GLOVE INDICATOR 8.0 STRL GRN (GLOVE) ×3 IMPLANT
GOWN STRL REUS W/ TWL LRG LVL3 (GOWN DISPOSABLE) ×2 IMPLANT
GOWN STRL REUS W/ TWL XL LVL3 (GOWN DISPOSABLE) ×1 IMPLANT
GOWN STRL REUS W/TWL LRG LVL3 (GOWN DISPOSABLE) ×4
GOWN STRL REUS W/TWL XL LVL3 (GOWN DISPOSABLE) ×2
GRASPER SUT 15 45D LOW PRO (SUTURE) IMPLANT
IV LACTATED RINGER IRRG 3000ML (IV SOLUTION) ×4
IV LR IRRIG 3000ML ARTHROMATIC (IV SOLUTION) ×2 IMPLANT
MANIFOLD NEPTUNE II (INSTRUMENTS) ×3 IMPLANT
MASK FACE SPIDER DISP (MASK) ×3 IMPLANT
MAT BLUE FLOOR 46X72 FLO (MISCELLANEOUS) ×3 IMPLANT
NDL MAYO CATGUT SZ5 (NEEDLE)
NDL SUT 5 .5 CRC TPR PNT MAYO (NEEDLE) IMPLANT
NEEDLE FILTER BLUNT 18X 1/2SAF (NEEDLE) ×2
NEEDLE FILTER BLUNT 18X1 1/2 (NEEDLE) ×1 IMPLANT
NEEDLE REVERSE CUT 1/2 CRC (NEEDLE) IMPLANT
PACK ARTHROSCOPY SHOULDER (MISCELLANEOUS) ×3 IMPLANT
PAD WRAPON POLAR SHDR XLG (MISCELLANEOUS) ×1 IMPLANT
SLING ARM LRG DEEP (SOFTGOODS) ×3 IMPLANT
SLING ULTRA II LG (MISCELLANEOUS) ×3 IMPLANT
STAPLER SKIN PROX 35W (STAPLE) ×3 IMPLANT
STRAP SAFETY BODY (MISCELLANEOUS) ×3 IMPLANT
SUT ETHIBOND 0 MO6 C/R (SUTURE) ×3 IMPLANT
SUT VIC AB 2-0 CT1 27 (SUTURE) ×4
SUT VIC AB 2-0 CT1 TAPERPNT 27 (SUTURE) ×2 IMPLANT
SYR 3ML LL SCALE MARK (SYRINGE) ×3 IMPLANT
TAPE MICROFOAM 4IN (TAPE) ×3 IMPLANT
TUBING ARTHRO INFLOW-ONLY STRL (TUBING) ×3 IMPLANT
TUBING CONNECTING 10 (TUBING) ×2 IMPLANT
TUBING CONNECTING 10' (TUBING) ×1
WAND HAND CNTRL MULTIVAC 90 (MISCELLANEOUS) ×3 IMPLANT
WRAPON POLAR PAD SHDR XLG (MISCELLANEOUS) ×3

## 2017-09-11 NOTE — H&P (Signed)
Paper H&P to be scanned into permanent record. H&P reviewed and patient re-examined. No changes. 

## 2017-09-11 NOTE — Op Note (Signed)
09/11/2017  9:23 AM  Patient:   Marc Weaver  Pre-Op Diagnosis:   Impingement/tendinopathy with rotator cuff tear and biceps tendinopathy, left shoulder.  Post-Op Diagnosis: Impingement/tendinopathy with rotator cuff tear and biceps tendinopathy, left shoulder.  Procedure: Limited arthroscopic debridement, arthroscopic subacromial decompression, mini-open rotator cuff repair, and mini-open biceps tenodesis, left shoulder.  Anesthesia: General endotracheal with interscalene block placed preoperatively by the anesthesiologist.  Surgeon:   Pascal Lux, MD  Assistant:   Cameron Proud, PA-C  Findings: As above.  There was moderate labral fraying anteriorly and superiorly without frank detachment of the labrum from the glenoid.  There was significant tendinopathy with partial-thickness tearing of the biceps tendon.  There was a full-thickness tear of the entire supraspinatus insertion extending into the anterior portion of the infraspinatus, as well as a near full-thickness tear involving the superior 30% of the subscapularis tendon insertional fibers.  The remainder of the rotator cuff was in excellent condition.  The glenoid and humeral articular surfaces both are in satisfactory condition.  Complications: None  Fluids:   600 cc  Estimated blood loss: 10 cc  Tourniquet time: None  Drains: None  Closure: Staples   Brief clinical note: The patient is a 66 year old male with a history of left shoulder pain. The patient's symptoms have progressed despite medications, activity modification, etc. The patient's history and examination are consistent with impingement/tendinopathy with a rotator cuff tear. These findings were confirmed by MRI scan. The patient presents at this time for definitive management of these shoulder symptoms.  Procedure: The patient underwent placement of an interscalene block by the anesthesiologist in the preoperative holding area before  being brought into the operating room and lain in the supine position. The patient then underwent general endotracheal intubation and anesthesia before being repositioned in the beach chair position using the beach chair positioner. The left shoulder and upper extremity were prepped with ChloraPrep solution before being draped sterilely. Preoperative antibiotics were administered. A timeout was performed to confirm the proper surgical site before the expected portal sites and incision site were injected with 0.5% Sensorcaine with epinephrine. A posterior portal was created and the glenohumeral joint thoroughly inspected with the findings as described above. An anterior portal was created using an outside-in technique. The labrum and rotator cuff were further probed, again confirming the above-noted findings. The areas of labral fraying were debrided back to stable margins using the full-radius resector. The torn margins of the rotator cuff tears also were debrided using the full-radius resector, as were areas of synovitis. The ArthroCare wand was inserted and used to release the biceps tendon from its labral attachment. It also was used to obtain hemostasis as well as to "anneal" the labrum superiorly and anteriorly. The instruments were removed from the joint after suctioning the excess fluid.  The camera was repositioned through the posterior portal into the subacromial space. A separate lateral portal was created using an outside-in technique. The 3.5 mm full-radius resector was introduced and used to perform a subtotal bursectomy. The ArthroCare wand was then inserted and used to remove the periosteal tissue off the undersurface of the anterior third of the acromion as well as to recess the coracoacromial ligament from its attachment along the anterior and lateral margins of the acromion. The 4.0 mm acromionizing bur was introduced and used to complete the decompression by removing the undersurface of the  anterior third of the acromion. The full radius resector was reintroduced to remove any residual bony debris before the ArthroCare  wand was reintroduced to obtain hemostasis. The instruments were then removed from the subacromial space after suctioning the excess fluid.  An approximately 4-5 cm incision was made over the anterolateral aspect of the shoulder beginning at the anterolateral corner of the acromion and extending distally in line with the bicipital groove. This incision was carried down through the subcutaneous tissues to expose the deltoid fascia. The raphae between the anterior and middle thirds was identified and this plane developed to provide access into the subacromial space. Additional bursal tissues were debrided sharply using Metzenbaum scissors. The rotator cuff tear was readily identified. The subscapularis tendon tear was approached first.  A short incision was made along the lines of the subscapularis tendon fibers directly over the torn portion of the subscapularis tendon to access the articular sided tear and adjacent lesser tuberosity. The exposed portion of the lesser tuberosity was roughened with a rongeur before the repair was completed using a single Biomet 2.9 mm JuggerKnot anchor. The repair was deemed stable to external rotation and appear to be watertight.    Attention was then directed to the supraspinatus tendon tear. The margins of this tear were debrided sharply with a #15 blade and the exposed greater tuberosity roughened with a rongeur. The tear was repaired using three Biomet 2.9 mm JuggerKnot anchors. These sutures were then brought back laterally and secured using two [place] QuatroLink anchors to create a two-layer closure. An apparent watertight closure was obtained.  The bicipital groove was identified by palpation and opened for 1-1.5 cm. The biceps tendon stump was retrieved through this defect. The floor of the bicipital groove was roughened with a curet before  another Biomet 2.9 mm JuggerKnot anchor was inserted. Both sets of sutures were passed through the biceps tendon and tied securely to effect the tenodesis. The bicipital sheath was reapproximated using two #0 Ethibond interrupted sutures, incorporating the biceps tendon to further reinforce the tenodesis.  The wound was copiously irrigated with sterile saline solution before the deltoid raphae was reapproximated using 2-0 Vicryl interrupted sutures. The subcutaneous tissues were closed in two layers using 2-0 Vicryl interrupted sutures before the skin was closed using staples. The portal sites also were closed using staples. A sterile bulky dressing was applied to the shoulder before the arm was placed into a shoulder immobilizer. The patient was then awakened, extubated, and returned to the recovery room in satisfactory condition after tolerating the procedure well.

## 2017-09-11 NOTE — Transfer of Care (Signed)
Immediate Anesthesia Transfer of Care Note  Patient: Kevonte Vanecek  Procedure(s) Performed: SHOULDER ARTHROSCOPY WITH OPEN ROTATOR CUFF REPAIR,BICEPS TENODEDSIS, AND DECOMPRESSION (Left Shoulder)  Patient Location: PACU  Anesthesia Type:GA combined with regional for post-op pain  Level of Consciousness: awake, alert , oriented and patient cooperative  Airway & Oxygen Therapy: Patient Spontanous Breathing and Patient connected to nasal cannula oxygen  Post-op Assessment: Report given to RN and Post -op Vital signs reviewed and stable  Post vital signs: Reviewed and stable  Last Vitals:  Vitals:   09/11/17 0725 09/11/17 0730  BP: 117/73 130/81  Pulse: 63 62  Resp: (!) 23 (!) 25  Temp:    SpO2: 93% 97%    Last Pain:  Vitals:   09/11/17 0725  TempSrc:   PainSc: 0-No pain         Complications: No apparent anesthesia complications

## 2017-09-11 NOTE — Addendum Note (Signed)
Addendum  created 09/11/17 1224 by Molli Barrows, MD   Child order released for a procedure order, Intraprocedure Blocks edited, Sign clinical note

## 2017-09-11 NOTE — Discharge Instructions (Addendum)
Keep dressing dry and intact.  May bathe after dressing changed on post-op day #4 (Tuesday).  Cover staples with Band-Aids after drying off. Apply ice frequently to shoulder. Take ibuprofen 800 mg TID OR Aleve 2 tabs BID OR Lodine 500 mg BID with meals for 7-10 days, then as necessary. Take oxycodone as prescribed when needed.  May supplement with ES Tylenol if necessary. Keep shoulder immobilizer on at all times except may remove for bathing purposes. Follow-up in 10-14 days or as scheduled.     AMBULATORY SURGERY  DISCHARGE INSTRUCTIONS   1) The drugs that you were given will stay in your system until tomorrow so for the next 24 hours you should not:  A) Drive an automobile B) Make any legal decisions C) Drink any alcoholic beverage   2) You may resume regular meals tomorrow.  Today it is better to start with liquids and gradually work up to solid foods.  You may eat anything you prefer, but it is better to start with liquids, then soup and crackers, and gradually work up to solid foods.   3) Please notify your doctor immediately if you have any unusual bleeding, trouble breathing, redness and pain at the surgery site, drainage, fever, or pain not relieved by medication.    4) Additional Instructions:Make sure you prop your left elbow up on pillows to maintain proper positioning of shoulder.  Use the polar care ice wrap consistently for first 3 days and then as desired.  If you do not use the wrap, then put ice on shoulder when just sitting or laying.  ### Use pain medicine for first few days, alternating with motrin or tylenol.  ###  Also, take stool softeners along with pain medicine.  Keep something in your stomach while taking these medications.               Get up and move around once home so as not to develop any blood clots.  Be kind to your wife and say thank you for all she does!!!   Please contact your physician with any problems or Same Day Surgery at [phone],  Monday through Friday 6 am to 4 pm, or [place] at [facility] number at [phone].

## 2017-09-11 NOTE — Anesthesia Procedure Notes (Signed)
Anesthesia Regional Block: Interscalene brachial plexus block   Pre-Anesthetic Checklist: ,, timeout performed, Correct Patient, Correct Site, Correct Laterality, Correct Procedure, Correct Position, site marked, Risks and benefits discussed,  Surgical consent,  Pre-op evaluation,  At surgeon's request and post-op pain management  Laterality: Left  Prep: chloraprep       Needles:  Injection technique: Single-shot  Needle Type: Echogenic Stimulator Needle     Needle Length: 10cm  Needle Gauge: 22     Additional Needles:   Procedures:, nerve stimulator,,, ultrasound used (permanent image in chart),,,,  Narrative:  Injection made incrementally with aspirations every 5 mL.  Performed by: Personally  Anesthesiologist: Molli Barrows, MD  Additional Notes: Functioning IV was confirmed and monitors were applied.  A 41mm 22ga Stimuplex needle was used. Sterile prep and drape,hand hygiene and sterile gloves were used.  Negative aspiration and negative test dose prior to incremental administration of local anesthetic. The patient tolerated the procedure well.

## 2017-09-11 NOTE — Anesthesia Post-op Follow-up Note (Signed)
Anesthesia QCDR form completed.        

## 2017-09-11 NOTE — Anesthesia Procedure Notes (Signed)
Procedure Name: Intubation Date/Time: 09/11/2017 7:42 AM Performed by: Eben Burow, CRNA Pre-anesthesia Checklist: Patient identified, Emergency Drugs available, Suction available, Patient being monitored and Timeout performed Patient Re-evaluated:Patient Re-evaluated prior to induction Oxygen Delivery Method: Circle system utilized Preoxygenation: Pre-oxygenation with 100% oxygen Induction Type: IV induction Ventilation: Two handed mask ventilation required Laryngoscope Size: Miller and 2 Grade View: Grade I Tube type: Oral Tube size: 7.5 mm Number of attempts: 1 Airway Equipment and Method: Stylet and LTA kit utilized Placement Confirmation: ETT inserted through vocal cords under direct vision,  positive ETCO2 and breath sounds checked- equal and bilateral Secured at: 23 cm Tube secured with: Tape Dental Injury: Teeth and Oropharynx as per pre-operative assessment

## 2017-09-11 NOTE — Anesthesia Preprocedure Evaluation (Signed)
Anesthesia Evaluation  Patient identified by MRN, date of birth, ID band Patient awake    Reviewed: Allergy & Precautions, H&P , NPO status , Patient's Chart, lab work & pertinent test results, reviewed documented beta blocker date and time   Airway Mallampati: II  TM Distance: >3 FB Neck ROM: full    Dental  (+) Teeth Intact   Pulmonary neg pulmonary ROS, sleep apnea ,    Pulmonary exam normal        Cardiovascular Exercise Tolerance: Good hypertension, On Medications + Peripheral Vascular Disease  negative cardio ROS Normal cardiovascular exam Rhythm:regular Rate:Normal     Neuro/Psych negative neurological ROS  negative psych ROS   GI/Hepatic negative GI ROS, Neg liver ROS, GERD  ,  Endo/Other  negative endocrine ROS  Renal/GU negative Renal ROS  negative genitourinary   Musculoskeletal   Abdominal   Peds  Hematology negative hematology ROS (+)   Anesthesia Other Findings Past Medical History: No date: AAA (abdominal aortic aneurysm) (H[facility])     Comment:  followed by dr Caryl Comes No date: Atrial myxoma     Comment:  removed 2008 No date: Barrett's esophagus No date: BPH (benign prostatic hyperplasia) No date: Degenerative disc disease, lumbar     Comment:  arthritis "all-over" back and knees No date: GERD (gastroesophageal reflux disease)     Comment:  no meds No date: Hypercholesteremia No date: Hyperglycemia No date: Hypertension     Comment:  controlled on meds No date: Numbness and tingling in left hand No date: Peyronie disease No date: Sleep apnea     Comment:  mild by sleep study, no cpap Past Surgical History: 02/09/2015: CARPAL TUNNEL RELEASE; Left     Comment:  Procedure: CARPAL TUNNEL RELEASE;  Surgeon: Leanor Kail, MD;  Location: [place];  Service:               Orthopedics;  Laterality: Left; 06/24/2004 AND 07/22/2013: COLONOSCOPY 10/03/2015: COLONOSCOPY; N/A   Comment:  Procedure: COLONOSCOPY;  Surgeon: Hulen Luster, MD;                Location: [place];  Service:               Gastroenterology;  Laterality: N/A; 07/22/2013: ESOPHAGOGASTRODUODENOSCOPY     Comment:  HEARTBURN,HEME-POSITIVE STOOL,POSITIVE BARRETT'S 10/03/2015: ESOPHAGOGASTRODUODENOSCOPY; N/A     Comment:  Procedure: ESOPHAGOGASTRODUODENOSCOPY (EGD);  Surgeon:               Hulen Luster, MD;  Location: [place];  Service:               Gastroenterology;  Laterality: N/A; No date: EXCISION OF ATRIAL MYXOMA 11/26/2012: KNEE ARTHROSCOPY; Right     Comment:  partial medial menisectomy and medial chondroplasty 1988: LUMBAR LAMINECTOMY     Comment:  L4,L5,L6 07/28/2013: MAKOPLASTY; Right     Comment:  MEDIAL COMPARTMENT,partial knee replacement No date: TONSILLECTOMY     Comment:  WITH ADENOIDECTOMY BMI    Body Mass Index:  34.44 kg/m     Reproductive/Obstetrics negative OB ROS                             Anesthesia Physical Anesthesia Plan  ASA: III  Anesthesia Plan: General ETT   Post-op Pain Management:  Regional for Post-op pain   Induction:   PONV Risk  Score and Plan: 3  Airway Management Planned:   Additional Equipment:   Intra-op Plan:   Post-operative Plan:   Informed Consent: I have reviewed the patients History and Physical, chart, labs and discussed the procedure including the risks, benefits and alternatives for the proposed anesthesia with the patient or authorized representative who has indicated his/her understanding and acceptance.   Dental Advisory Given  Plan Discussed with: CRNA  Anesthesia Plan Comments:         Anesthesia Quick Evaluation

## 2017-09-11 NOTE — Anesthesia Postprocedure Evaluation (Signed)
Anesthesia Post Note  Patient: Marc Weaver  Procedure(s) Performed: SHOULDER ARTHROSCOPY WITH OPEN ROTATOR CUFF REPAIR,BICEPS TENODEDSIS, AND DECOMPRESSION (Left Shoulder)  Patient location during evaluation: PACU Anesthesia Type: General Level of consciousness: awake and alert Pain management: pain level controlled Vital Signs Assessment: post-procedure vital signs reviewed and stable Respiratory status: spontaneous breathing, nonlabored ventilation, respiratory function stable and patient connected to nasal cannula oxygen Cardiovascular status: blood pressure returned to baseline and stable Postop Assessment: no apparent nausea or vomiting Anesthetic complications: no     Last Vitals:  Vitals:   09/11/17 1015 09/11/17 1052  BP: (!) 157/70   Pulse: 66 70  Resp: 16 15  Temp: (!) 35.3 C   SpO2: 97% 96%    Last Pain:  Vitals:   09/11/17 1015  TempSrc: Tympanic  PainSc: 0-No pain                 Molli Barrows

## 2020-09-14 ENCOUNTER — Other Ambulatory Visit: Payer: Self-pay | Admitting: Internal Medicine

## 2020-09-14 DIAGNOSIS — I714 Abdominal aortic aneurysm, without rupture, unspecified: Secondary | ICD-10-CM

## 2020-10-01 ENCOUNTER — Other Ambulatory Visit: Payer: Self-pay

## 2020-10-01 ENCOUNTER — Ambulatory Visit
Admission: RE | Admit: 2020-10-01 | Discharge: 2020-10-01 | Disposition: A | Discharge status: Home / Self Care | Payer: Medicare HMO | Source: Ambulatory Visit | Attending: Internal Medicine | Admitting: Internal Medicine

## 2020-10-01 DIAGNOSIS — I714 Abdominal aortic aneurysm, without rupture, unspecified: Secondary | ICD-10-CM

## 2021-03-13 ENCOUNTER — Inpatient Hospital Stay
Admission: EM | Admit: 2021-03-13 | Discharge: 2021-03-16 | DRG: 871 | Disposition: A | Discharge status: Home / Self Care | Payer: Medicare HMO | Attending: Internal Medicine | Admitting: Internal Medicine

## 2021-03-13 ENCOUNTER — Other Ambulatory Visit: Payer: Self-pay

## 2021-03-13 ENCOUNTER — Emergency Department: Payer: Medicare HMO

## 2021-03-13 ENCOUNTER — Ambulatory Visit
Admission: RE | Admit: 2021-03-13 | Discharge: 2021-03-13 | Disposition: A | Discharge status: Home / Self Care | Payer: Medicare HMO | Source: Ambulatory Visit | Attending: Family Medicine | Admitting: Family Medicine

## 2021-03-13 VITALS — BP 102/61 | HR 73 | Temp 98.4°F | Resp 22 | Ht 70.0 in | Wt 240.1 lb

## 2021-03-13 DIAGNOSIS — Z20822 Contact with and (suspected) exposure to covid-19: Secondary | ICD-10-CM | POA: Diagnosis present

## 2021-03-13 DIAGNOSIS — E871 Hypo-osmolality and hyponatremia: Secondary | ICD-10-CM

## 2021-03-13 DIAGNOSIS — A419 Sepsis, unspecified organism: Secondary | ICD-10-CM | POA: Diagnosis present

## 2021-03-13 DIAGNOSIS — K219 Gastro-esophageal reflux disease without esophagitis: Secondary | ICD-10-CM | POA: Diagnosis present

## 2021-03-13 DIAGNOSIS — E785 Hyperlipidemia, unspecified: Secondary | ICD-10-CM | POA: Diagnosis present

## 2021-03-13 DIAGNOSIS — Z79899 Other long term (current) drug therapy: Secondary | ICD-10-CM | POA: Diagnosis not present

## 2021-03-13 DIAGNOSIS — N401 Enlarged prostate with lower urinary tract symptoms: Secondary | ICD-10-CM | POA: Diagnosis present

## 2021-03-13 DIAGNOSIS — E876 Hypokalemia: Secondary | ICD-10-CM

## 2021-03-13 DIAGNOSIS — R6520 Severe sepsis without septic shock: Secondary | ICD-10-CM | POA: Diagnosis present

## 2021-03-13 DIAGNOSIS — N390 Urinary tract infection, site not specified: Secondary | ICD-10-CM | POA: Diagnosis present

## 2021-03-13 DIAGNOSIS — I10 Essential (primary) hypertension: Secondary | ICD-10-CM | POA: Diagnosis present

## 2021-03-13 DIAGNOSIS — N39498 Other specified urinary incontinence: Secondary | ICD-10-CM | POA: Diagnosis present

## 2021-03-13 DIAGNOSIS — Z800 Family history of malignant neoplasm of digestive organs: Secondary | ICD-10-CM | POA: Diagnosis not present

## 2021-03-13 DIAGNOSIS — N3000 Acute cystitis without hematuria: Secondary | ICD-10-CM

## 2021-03-13 DIAGNOSIS — N179 Acute kidney failure, unspecified: Secondary | ICD-10-CM

## 2021-03-13 DIAGNOSIS — E7800 Pure hypercholesterolemia, unspecified: Secondary | ICD-10-CM | POA: Diagnosis present

## 2021-03-13 DIAGNOSIS — A4151 Sepsis due to Escherichia coli [E. coli]: Principal | ICD-10-CM | POA: Diagnosis present

## 2021-03-13 DIAGNOSIS — Z7982 Long term (current) use of aspirin: Secondary | ICD-10-CM

## 2021-03-13 DIAGNOSIS — I959 Hypotension, unspecified: Secondary | ICD-10-CM

## 2021-03-13 DIAGNOSIS — N170 Acute kidney failure with tubular necrosis: Secondary | ICD-10-CM | POA: Diagnosis present

## 2021-03-13 DIAGNOSIS — R319 Hematuria, unspecified: Secondary | ICD-10-CM

## 2021-03-13 LAB — COMPREHENSIVE METABOLIC PANEL
ALT: 32 U/L (ref 0–44)
AST: 33 U/L (ref 15–41)
Albumin: 3.6 g/dL (ref 3.5–5.0)
Alkaline Phosphatase: 79 U/L (ref 38–126)
Anion gap: 8 (ref 5–15)
BUN: 47 mg/dL — ABNORMAL HIGH (ref 8–23)
CO2: 27 mmol/L (ref 22–32)
Calcium: 8.3 mg/dL — ABNORMAL LOW (ref 8.9–10.3)
Chloride: 94 mmol/L — ABNORMAL LOW (ref 98–111)
Creatinine, Ser: 1.88 mg/dL — ABNORMAL HIGH (ref 0.61–1.24)
GFR, Estimated: 38 mL/min — ABNORMAL LOW (ref >60)
Glucose, Bld: 125 mg/dL — ABNORMAL HIGH (ref 70–99)
Potassium: 3.3 mmol/L — ABNORMAL LOW (ref 3.5–5.1)
Sodium: 129 mmol/L — ABNORMAL LOW (ref 135–145)
Total Bilirubin: 1.1 mg/dL (ref 0.3–1.2)
Total Protein: 6.4 g/dL — ABNORMAL LOW (ref 6.5–8.1)

## 2021-03-13 LAB — BASIC METABOLIC PANEL
Anion gap: 7 (ref 5–15)
BUN: 42 mg/dL — ABNORMAL HIGH (ref 8–23)
CO2: 25 mmol/L (ref 22–32)
Calcium: 7.9 mg/dL — ABNORMAL LOW (ref 8.9–10.3)
Chloride: 100 mmol/L (ref 98–111)
Creatinine, Ser: 1.61 mg/dL — ABNORMAL HIGH (ref 0.61–1.24)
GFR, Estimated: 46 mL/min — ABNORMAL LOW (ref >60)
Glucose, Bld: 117 mg/dL — ABNORMAL HIGH (ref 70–99)
Potassium: 3.1 mmol/L — ABNORMAL LOW (ref 3.5–5.1)
Sodium: 132 mmol/L — ABNORMAL LOW (ref 135–145)

## 2021-03-13 LAB — CBC WITH DIFFERENTIAL/PLATELET
Abs Immature Granulocytes: 0.04 10*3/uL (ref 0.00–0.07)
Abs Immature Granulocytes: 0.05 10*3/uL (ref 0.00–0.07)
Basophils Absolute: 0 10*3/uL (ref 0.0–0.1)
Basophils Absolute: 0 10*3/uL (ref 0.0–0.1)
Basophils Relative: 0 %
Basophils Relative: 0 %
Eosinophils Absolute: 0 10*3/uL (ref 0.0–0.5)
Eosinophils Absolute: 0 10*3/uL (ref 0.0–0.5)
Eosinophils Relative: 0 %
Eosinophils Relative: 0 %
HCT: 39.7 % (ref 39.0–52.0)
HCT: 38.8 % — ABNORMAL LOW (ref 39.0–52.0)
Hemoglobin: 13.9 g/dL (ref 13.0–17.0)
Hemoglobin: 13.6 g/dL (ref 13.0–17.0)
Immature Granulocytes: 1 %
Immature Granulocytes: 1 %
Lymphocytes Relative: 9 %
Lymphocytes Relative: 12 %
Lymphs Abs: 0.6 10*3/uL — ABNORMAL LOW (ref 0.7–4.0)
Lymphs Abs: 0.7 10*3/uL (ref 0.7–4.0)
MCH: 32 pg (ref 26.0–34.0)
MCH: 32.7 pg (ref 26.0–34.0)
MCHC: 35 g/dL (ref 30.0–36.0)
MCHC: 35.1 g/dL (ref 30.0–36.0)
MCV: 91.5 fL (ref 80.0–100.0)
MCV: 93.3 fL (ref 80.0–100.0)
Monocytes Absolute: 0.7 10*3/uL (ref 0.1–1.0)
Monocytes Absolute: 0.7 10*3/uL (ref 0.1–1.0)
Monocytes Relative: 10 %
Monocytes Relative: 11 %
Neutro Abs: 5.1 10*3/uL (ref 1.7–7.7)
Neutro Abs: 4.6 10*3/uL (ref 1.7–7.7)
Neutrophils Relative %: 80 %
Neutrophils Relative %: 76 %
Platelets: 130 10*3/uL — ABNORMAL LOW (ref 150–400)
Platelets: 127 10*3/uL — ABNORMAL LOW (ref 150–400)
RBC: 4.34 MIL/uL (ref 4.22–5.81)
RBC: 4.16 MIL/uL — ABNORMAL LOW (ref 4.22–5.81)
RDW: 13.2 % (ref 11.5–15.5)
RDW: 13.1 % (ref 11.5–15.5)
WBC: 6.5 10*3/uL (ref 4.0–10.5)
WBC: 6 10*3/uL (ref 4.0–10.5)
nRBC: 0 % (ref 0.0–0.2)
nRBC: 0 % (ref 0.0–0.2)

## 2021-03-13 LAB — URINALYSIS, COMPLETE (UACMP) WITH MICROSCOPIC
Glucose, UA: NEGATIVE mg/dL
Ketones, ur: NEGATIVE mg/dL
Nitrite: NEGATIVE
Protein, ur: NEGATIVE mg/dL
Specific Gravity, Urine: 1.01 (ref 1.005–1.030)
WBC, UA: >50 WBC/hpf (ref 0–5)
pH: 5.5 (ref 5.0–8.0)

## 2021-03-13 LAB — LACTIC ACID, PLASMA
Lactic Acid, Venous: 1 mmol/L (ref 0.5–1.9)
Lactic Acid, Venous: 1.1 mmol/L (ref 0.5–1.9)
Lactic Acid, Venous: 2.3 mmol/L (ref 0.5–1.9)

## 2021-03-13 LAB — MAGNESIUM: Magnesium: 2 mg/dL (ref 1.7–2.4)

## 2021-03-13 MED ORDER — ONDANSETRON HCL 4 MG PO TABS: 4.0000 mg | ORAL_TABLET | Freq: Four times a day (QID) | ORAL | Status: DC | PRN

## 2021-03-13 MED ORDER — LOSARTAN POTASSIUM 50 MG PO TABS: 50.0000 mg | ORAL_TABLET | Freq: Every day | ORAL | Status: DC

## 2021-03-13 MED ORDER — TRAZODONE HCL 50 MG PO TABS
50.0000 mg | ORAL_TABLET | Freq: Every day | ORAL | Status: DC
  Administered 2021-03-13 – 2021-03-15 (×3): 50 mg via ORAL
  Filled 2021-03-13 (×3): qty 1

## 2021-03-13 MED ORDER — SODIUM CHLORIDE 0.9 % IV SOLN
1.0000 g | INTRAVENOUS | Status: DC
  Administered 2021-03-14: 1 g via INTRAVENOUS
  Filled 2021-03-13: qty 1

## 2021-03-13 MED ORDER — ONDANSETRON HCL 4 MG/2ML IJ SOLN
4.0000 mg | Freq: Once | INTRAMUSCULAR | Status: AC
  Administered 2021-03-13: 4 mg via INTRAVENOUS
  Filled 2021-03-13: qty 2

## 2021-03-13 MED ORDER — LACTATED RINGERS IV BOLUS
1000.0000 mL | Freq: Once | INTRAVENOUS | Status: AC
  Administered 2021-03-13: 1000 mL via INTRAVENOUS

## 2021-03-13 MED ORDER — POTASSIUM CHLORIDE 2 MEQ/ML IV SOLN
INTRAVENOUS | Status: DC
  Filled 2021-03-13 (×11): qty 1000

## 2021-03-13 MED ORDER — CLONIDINE HCL 0.1 MG PO TABS
0.1000 mg | ORAL_TABLET | Freq: Every day | ORAL | Status: DC
  Administered 2021-03-13 – 2021-03-15 (×3): 0.1 mg via ORAL
  Filled 2021-03-13 (×3): qty 1

## 2021-03-13 MED ORDER — LACTATED RINGERS IV SOLN: INTRAVENOUS | Status: DC

## 2021-03-13 MED ORDER — SODIUM CHLORIDE 0.9 % IV BOLUS
1000.0000 mL | Freq: Once | INTRAVENOUS | Status: AC
  Administered 2021-03-13: 1000 mL via INTRAVENOUS

## 2021-03-13 MED ORDER — ASPIRIN EC 81 MG PO TBEC
81.0000 mg | DELAYED_RELEASE_TABLET | Freq: Every day | ORAL | Status: DC
  Administered 2021-03-13 – 2021-03-15 (×3): 81 mg via ORAL
  Filled 2021-03-13 (×3): qty 1

## 2021-03-13 MED ORDER — ATORVASTATIN CALCIUM 20 MG PO TABS
40.0000 mg | ORAL_TABLET | Freq: Every day | ORAL | Status: DC
  Administered 2021-03-14 – 2021-03-16 (×3): 40 mg via ORAL
  Filled 2021-03-13 (×3): qty 2

## 2021-03-13 MED ORDER — ACETAMINOPHEN 500 MG PO TABS
1000.0000 mg | ORAL_TABLET | Freq: Once | ORAL | Status: AC
  Administered 2021-03-13: 1000 mg via ORAL
  Filled 2021-03-13: qty 2

## 2021-03-13 MED ORDER — ENOXAPARIN SODIUM 60 MG/0.6ML IJ SOSY
50.0000 mg | PREFILLED_SYRINGE | INTRAMUSCULAR | Status: DC
  Administered 2021-03-13 – 2021-03-15 (×3): 50 mg via SUBCUTANEOUS
  Filled 2021-03-13 (×3): qty 0.6

## 2021-03-13 MED ORDER — RISAQUAD PO CAPS
1.0000 | ORAL_CAPSULE | Freq: Every day | ORAL | Status: DC
  Administered 2021-03-14 – 2021-03-16 (×3): 1 via ORAL
  Filled 2021-03-13 (×3): qty 1

## 2021-03-13 MED ORDER — PANTOPRAZOLE SODIUM 40 MG PO TBEC
40.0000 mg | DELAYED_RELEASE_TABLET | Freq: Every day | ORAL | Status: DC
  Administered 2021-03-14 – 2021-03-16 (×3): 40 mg via ORAL
  Filled 2021-03-13 (×3): qty 1

## 2021-03-13 MED ORDER — ACETAMINOPHEN 650 MG RE SUPP: 650.0000 mg | Freq: Four times a day (QID) | RECTAL | Status: DC | PRN

## 2021-03-13 MED ORDER — ACETAMINOPHEN 325 MG PO TABS
650.0000 mg | ORAL_TABLET | Freq: Four times a day (QID) | ORAL | Status: DC | PRN
  Administered 2021-03-14 (×3): 650 mg via ORAL
  Filled 2021-03-13 (×3): qty 2

## 2021-03-13 MED ORDER — ONDANSETRON HCL 4 MG/2ML IJ SOLN: 4.0000 mg | Freq: Four times a day (QID) | INTRAMUSCULAR | Status: DC | PRN

## 2021-03-13 MED ORDER — SODIUM CHLORIDE 0.9 % IV SOLN
1.0000 g | Freq: Once | INTRAVENOUS | Status: AC
  Administered 2021-03-13: 1 g via INTRAVENOUS
  Filled 2021-03-13: qty 10

## 2021-03-13 NOTE — ED Notes (Signed)
Transport requested to inpatient room.

## 2021-03-13 NOTE — ED Notes (Signed)
Dr. Francine Graven at bedside. Dr. Francine Graven aware of continued fever. Will recheck temp prior to transport to inpatient unit.

## 2021-03-13 NOTE — ED Notes (Signed)
Clothing removed, extra blankets removed, more ice packs applied to patient. Patient and wife updated on POC.

## 2021-03-13 NOTE — Plan of Care (Signed)

## 2021-03-13 NOTE — ED Notes (Signed)
Ice packs placed on pt per dr Tamala Julian, aware of temnp 103

## 2021-03-13 NOTE — ED Provider Notes (Signed)
[facility] Emergency Department Provider Note ____________________________________________   Event Date/Time   First MD Initiated Contact with Patient 03/13/21 1509     (approximate)  I have reviewed the triage vital signs and the nursing notes.  HISTORY  Chief Complaint Hypotension   HPI Marc Weaver is a 70 y.o. malewho presents to the ED for evaluation of hypotension  Chart review indicates pt seen at local urgent care just prior to this, and sent to the ED due to persistent hypotension at the [facility]. UA w large leuks c/w UTI, and sent for culture. CBC wnl, CMP with signs of prerenal Aki, mild HypoNa and hypoK. Otherwise hx HTN on 3 agents, HLD, AAA 3.3cm in 2018, atrial myxoma.    Patient reports about 3 days of generalized weakness, dysuria, urinary urgency and new incontinence.  Reports poor p.o. intake and presyncopal lightheaded dizziness upon standing without syncope.  Denies any chest pain, abdominal pain, flank pain or back pain, denies any falls or trauma.  Denies fevers, but does report cold chills yesterday.  His wife was concerned that he had a UTI, so finally made him go to urgent care this morning.  Patient received about 1500 mL of fluid with EMS in urgent care prior to arrival to the ED, he reports feeling better now.  He has not started any antibiotics, and he has had no recent antibiotics.  Past Medical History:  Diagnosis Date   AAA (abdominal aortic aneurysm) ([place])    followed by dr Caryl Comes   Atrial myxoma    removed 2008   Barrett's esophagus    BPH (benign prostatic hyperplasia)    Degenerative disc disease, lumbar    arthritis "all-over" back and knees   GERD (gastroesophageal reflux disease)    no meds   Hypercholesteremia    Hyperglycemia    Hypertension    controlled on meds   Numbness and tingling in left hand    Peyronie disease    Sleep apnea    mild by sleep study, no cpap    There are no problems to display for this  patient.   Past Surgical History:  Procedure Laterality Date   CARPAL TUNNEL RELEASE Left 02/09/2015   Procedure: CARPAL TUNNEL RELEASE;  Surgeon: Leanor Kail, MD;  Location: [place];  Service: Orthopedics;  Laterality: Left;   COLONOSCOPY  06/24/2004 AND 07/22/2013   COLONOSCOPY N/A 10/03/2015   Procedure: COLONOSCOPY;  Surgeon: Hulen Luster, MD;  Location: [place];  Service: Gastroenterology;  Laterality: N/A;   ESOPHAGOGASTRODUODENOSCOPY  07/22/2013   HEARTBURN,HEME-POSITIVE STOOL,POSITIVE BARRETT'S   ESOPHAGOGASTRODUODENOSCOPY N/A 10/03/2015   Procedure: ESOPHAGOGASTRODUODENOSCOPY (EGD);  Surgeon: Hulen Luster, MD;  Location: [place];  Service: Gastroenterology;  Laterality: N/A;   EXCISION OF ATRIAL MYXOMA     KNEE ARTHROSCOPY Right 11/26/2012   partial medial menisectomy and medial chondroplasty   LUMBAR LAMINECTOMY  1988   L4,L5,L6   MAKOPLASTY Right 07/28/2013   MEDIAL COMPARTMENT,partial knee replacement   SHOULDER ARTHROSCOPY WITH OPEN ROTATOR CUFF REPAIR Left 09/11/2017   Procedure: SHOULDER ARTHROSCOPY WITH OPEN ROTATOR CUFF REPAIR,BICEPS TENODEDSIS, AND DECOMPRESSION;  Surgeon: Corky Mull, MD;  Location: [facility];  Service: Orthopedics;  Laterality: Left;   TONSILLECTOMY     WITH ADENOIDECTOMY    Prior to Admission medications   Medication Sig Start Date End Date Taking? Authorizing Provider  acidophilus (RISAQUAD) CAPS capsule Take 1 capsule by mouth daily.   Yes [provider]  aspirin  81 MG tablet Take 81 mg by mouth at bedtime.   Yes [provider]  atorvastatin (LIPITOR) 40 MG tablet Take 40 mg by mouth daily.   Yes [provider]  chlorthalidone (HYGROTON) 25 MG tablet Take 25 mg by mouth daily.   Yes [provider]  cloNIDine (CATAPRES) 0.1 MG tablet Take 0.1 mg by mouth at bedtime.   Yes [provider]  etodolac (LODINE) 500 MG tablet Take 500 mg by mouth daily.   Yes [provider]  losartan (COZAAR) 50 MG tablet Take 50 mg by mouth daily.   Yes [provider]  pantoprazole (PROTONIX) 40 MG tablet Take 40 mg by mouth daily.   Yes [provider]  traZODone (DESYREL) 50 MG tablet Take 50 mg by mouth at bedtime. 07/24/17  Yes [provider]    Allergies Gabapentin, Other, and Yeast-related products  No family history on file.  Social History Social History   Tobacco Use   Smoking status: Never   Smokeless tobacco: Never  Vaping Use   Vaping Use: Never used  Substance Use Topics   Alcohol use: Yes    Comment: occ   Drug use: No    Review of Systems  Constitutional: Positive for subjective fever/chills, generalized weakness and presyncopal dizziness Eyes: No visual changes. ENT: No sore throat. Cardiovascular: Denies chest pain. Respiratory: Denies shortness of breath. Gastrointestinal: No abdominal pain.  No nausea, no vomiting.  No diarrhea.  No constipation. Genitourinary: Positive for dysuria, urgency and new incontinence. Musculoskeletal: Negative for back pain. Skin: Negative for rash. Neurological: Negative for headaches, focal weakness or numbness.  ____________________________________________   PHYSICAL EXAM:  VITAL SIGNS: Vitals:   03/13/21 1739 03/13/21 1745  BP:    Pulse:  (!) 107  Resp:  (!) 27  Temp: 99.6 F (37.6 C)   SpO2:  90%     Constitutional: Alert and oriented. Well appearing and in no acute distress.  Pleasant and conversational without distress. Eyes: Conjunctivae are normal. PERRL. EOMI. Head: Atraumatic. Nose: No congestion/rhinnorhea. Mouth/Throat: Mucous membranes are moist.  Oropharynx non-erythematous. Neck: No stridor. No cervical spine tenderness to palpation. Cardiovascular: Normal rate, regular rhythm. Grossly normal heart sounds.  Good peripheral circulation. Respiratory: Normal respiratory effort.  No retractions. Lungs CTAB. Gastrointestinal: Soft ,  nondistended. No CVA tenderness. Minimal suprapubic tenderness without peritoneal features, abdomen is otherwise benign. Musculoskeletal: No lower extremity tenderness nor edema.  No joint effusions. No signs of acute trauma. Neurologic:  Normal speech and language. No gross focal neurologic deficits are appreciated.  Skin:  Skin is warm, dry and intact. No rash noted. Psychiatric: Mood and affect are normal. Speech and behavior are normal.  ____________________________________________   LABS (all labs ordered are listed, but only abnormal results are displayed)  Labs Reviewed  BASIC METABOLIC PANEL - Abnormal; Notable for the following components:      Result Value   Sodium 132 (*)    Potassium 3.1 (*)    Glucose, Bld 117 (*)    BUN 42 (*)    Creatinine, Ser 1.61 (*)    Calcium 7.9 (*)    GFR, Estimated 46 (*)    All other components within normal limits  CBC WITH DIFFERENTIAL/PLATELET - Abnormal; Notable for the following components:   RBC 4.16 (*)    HCT 38.8 (*)    Platelets 127 (*)    All other components within normal limits  CULTURE, BLOOD (ROUTINE X 2)  CULTURE, BLOOD (ROUTINE  X 2)  SARS CORONAVIRUS 2 (TAT 6-24 HRS)  MAGNESIUM  LACTIC ACID, PLASMA   ____________________________________________  12 Lead EKG  Sinus rhythm, rate of 68 bpm.  Normal axis.  Incomplete right bundle.  No evidence of acute ischemia. ____________________________________________  RADIOLOGY  ED MD interpretation: CT reviewed by me without evidence of urologic obstruction.  Official radiology report(s): CT ABDOMEN PELVIS WO CONTRAST  Result Date: 03/13/2021 CLINICAL DATA:  UTI. EXAM: CT ABDOMEN AND PELVIS WITHOUT CONTRAST TECHNIQUE: Multidetector CT imaging of the abdomen and pelvis was performed following the standard protocol without IV contrast. COMPARISON:  None. FINDINGS: Lower chest: No acute abnormality. Hepatobiliary: No focal liver abnormality is seen. No gallstones, gallbladder  wall thickening, or biliary dilatation. Pancreas: Unremarkable. No pancreatic ductal dilatation or surrounding inflammatory changes. Spleen: Normal in size without focal abnormality. Adrenals/Urinary Tract: No evidence of hydronephrosis or nephrolithiasis. 3.1 cm right renal cyst. Normal urinary bladder. Normal adrenal glands. Stomach/Bowel: Stomach is within normal limits. No evidence of appendicitis. No evidence of bowel wall thickening, distention, or inflammatory changes. Diffusely scattered colonic diverticulosis, particularly of the sigmoid colon without evidence of diverticulitis. Vascular/Lymphatic: Tortuosity and calcific atherosclerotic disease of the aorta. 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 Reproductive: Prostate is unremarkable. Other: No abdominal wall hernia or abnormality. No abdominopelvic ascites. Musculoskeletal: Spondylosis of the spine. IMPRESSION: 1. No evidence of nephrolithiasis or obstructive uropathy. 2. 3.1 cm right renal cyst. 3. Diffusely scattered colonic diverticulosis without evidence of diverticulitis. 4. 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 Recommend follow-up ultrasound every 2 years. This recommendation follows ACR consensus guidelines: White Paper of the ACR Incidental Findings Committee II on Vascular Findings. J Am Coll Radiol 2013; 10:789-794. Aortic Atherosclerosis (ICD10-I70.0). Electronically Signed   By: Fidela Salisbury M.D.   On: 03/13/2021 16:19    ____________________________________________   PROCEDURES and INTERVENTIONS  Procedure(s) performed (including Critical Care):  .1-3 Lead EKG Interpretation  Date/Time: 03/13/2021 6:08 PM Performed by: Vladimir Crofts, MD Authorized by: Vladimir Crofts, MD     Interpretation: abnormal     ECG rate:  106   ECG rate assessment: tachycardic     Rhythm: sinus  tachycardia     Ectopy: none     Conduction: normal    Medications  lactated ringers bolus 1,000 mL (0 mLs Intravenous Stopped 03/13/21 1741)  cefTRIAXone (ROCEPHIN) 1 g in sodium chloride 0.9 % 100 mL IVPB (0 g Intravenous Stopped 03/13/21 1805)  acetaminophen (TYLENOL) tablet 1,000 mg (1,000 mg Oral Given 03/13/21 1743)    ____________________________________________   MDM / ED COURSE   70 year old male presents to the ED with evidence of UTI and AKI, without urologic obstruction, and requiring medical observation admission.  Presents with normal vital signs and clinically well-appearing.  His work-up is similar to that that was obtained at urgent care with evidence of AKI and mild hypokalemia.  CT imaging without evidence of urologic obstruction or intra-abdominal pathology.  No evidence of sepsis.  On my reassessment after fluids, Tylenol and Rocephin, he looks clinically worse to me.  He is tachycardic, shivering and does not appear well.  Considering his clinical decline, AKI, we will discussed the case with hospitalist for admission.  Clinical Course as of 03/13/21 1808  Wed Mar 13, 2021  1806 Reassessed.  Patient tachycardic, shivering and appears uncomfortable.  I am concerned that he is clinically worse than when he presented.  Although  his blood work is overall reassuring, just an AKI and hypokalemia, I am uncomfortable discharging him considering his clinical decline.  We will discussed the case with hospitalist. [DS]    Clinical Course User Index [DS] Vladimir Crofts, MD    ____________________________________________   FINAL CLINICAL IMPRESSION(S) / ED DIAGNOSES  Final diagnoses:  AKI (acute kidney injury) ([place])  Acute cystitis without hematuria     ED Discharge Orders     None        Molleigh Huot [place]   Note:  This document was prepared using Dragon voice recognition software and may include unintentional dictation errors.    Vladimir Crofts, MD 03/13/21 [phone]

## 2021-03-13 NOTE — ED Notes (Signed)
Patient is being discharged from the Urgent Care and sent to the Emergency Department via EMS . Per Dr. Lacinda Axon, patient is in need of higher level of care due to Sepsis. Patient is aware and verbalizes understanding of plan of care.  Vitals:   03/13/21 1315 03/13/21 1422  BP: (!) 83/69 102/61  Pulse: 73   Resp: 18 (!) 22  Temp: 98.4 F (36.9 C) 98.4 F (36.9 C)  SpO2: 100% 98%

## 2021-03-13 NOTE — ED Provider Notes (Signed)
[facility]    CSN: [id number] Arrival date & time: 03/13/21  1257      History   Chief Complaint Chief Complaint  Patient presents with   Dysuria   Hypotension   HPI  70 year old male presents for evaluation the above.  Wife states that she recently suffered a GI illness and he subsequently got sick on Monday.  He has had nausea and one episode of emesis.  No diarrhea.  Denies abdominal pain.  The symptoms have improved.  He subsequently developed urinary frequency and urgency.  He has had some associated incontinence.  Also reports dysuria.  Denies chest pain.  Denies shortness of breath.  No reports of diaphoresis although he is sweating in the room.  He states that he has some dizziness particular when he stands up quickly.  He states that his blood pressure has been low at home.  He endorses compliance with his home medications.  He is taking 3 antihypertensive per the EMR.  His blood pressure here on arrival is 83/69.  He states that he does not feel well.  He is able to eat and drink.  He states that he is trying to stay hydrated.  Denies any rectal pain.  No back pain or flank pain.  Subjective fever but no documented fever.  No other reported symptoms.  No other complaints.  Past Medical History:  Diagnosis Date   AAA (abdominal aortic aneurysm) ([place])    followed by dr Caryl Comes   Atrial myxoma    removed 2008   Barrett's esophagus    BPH (benign prostatic hyperplasia)    Degenerative disc disease, lumbar    arthritis "all-over" back and knees   GERD (gastroesophageal reflux disease)    no meds   Hypercholesteremia    Hyperglycemia    Hypertension    controlled on meds   Numbness and tingling in left hand    Peyronie disease    Sleep apnea    mild by sleep study, no cpap    There are no problems to display for this patient.   Past Surgical History:  Procedure Laterality Date   CARPAL TUNNEL RELEASE Left 02/09/2015   Procedure: CARPAL TUNNEL RELEASE;   Surgeon: Leanor Kail, MD;  Location: [place];  Service: Orthopedics;  Laterality: Left;   COLONOSCOPY  06/24/2004 AND 07/22/2013   COLONOSCOPY N/A 10/03/2015   Procedure: COLONOSCOPY;  Surgeon: Hulen Luster, MD;  Location: [place];  Service: Gastroenterology;  Laterality: N/A;   ESOPHAGOGASTRODUODENOSCOPY  07/22/2013   HEARTBURN,HEME-POSITIVE STOOL,POSITIVE BARRETT'S   ESOPHAGOGASTRODUODENOSCOPY N/A 10/03/2015   Procedure: ESOPHAGOGASTRODUODENOSCOPY (EGD);  Surgeon: Hulen Luster, MD;  Location: [place];  Service: Gastroenterology;  Laterality: N/A;   EXCISION OF ATRIAL MYXOMA     KNEE ARTHROSCOPY Right 11/26/2012   partial medial menisectomy and medial chondroplasty   LUMBAR LAMINECTOMY  1988   L4,L5,L6   MAKOPLASTY Right 07/28/2013   MEDIAL COMPARTMENT,partial knee replacement   SHOULDER ARTHROSCOPY WITH OPEN ROTATOR CUFF REPAIR Left 09/11/2017   Procedure: SHOULDER ARTHROSCOPY WITH OPEN ROTATOR CUFF REPAIR,BICEPS TENODEDSIS, AND DECOMPRESSION;  Surgeon: Corky Mull, MD;  Location: [facility];  Service: Orthopedics;  Laterality: Left;   TONSILLECTOMY     WITH ADENOIDECTOMY       Home Medications    Prior to Admission medications   Medication Sig Start Date End Date Taking? Authorizing Provider  acetaminophen (TYLENOL) 500 MG tablet Take 1,500 mg by mouth every 6 (six) hours as needed  for moderate pain or headache.   Yes [provider]  aspirin 81 MG tablet Take 81 mg by mouth daily.    Yes [provider]  atorvastatin (LIPITOR) 40 MG tablet Take 40 mg by mouth daily at 6 PM.  08/24/17  Yes [provider]  chlorthalidone (HYGROTON) 25 MG tablet Take 1 tablet by mouth every morning. 03/11/21  Yes [provider]  cloNIDine (CATAPRES) 0.1 MG tablet Take 0.1 mg by mouth 2 (two) times daily.    Yes [provider]  etodolac (LODINE) 500 MG tablet Take 500 mg by mouth daily as needed (for pain).    Yes [provider]  losartan (COZAAR) 100 MG tablet Take 100 mg by mouth at bedtime.    Yes [provider]  naproxen sodium (ALEVE) 220 MG tablet Take 660 mg by mouth daily as needed (for pain).   Yes [provider]  traZODone (DESYREL) 50 MG tablet Take 50 mg by mouth at bedtime. 07/24/17  Yes [provider]  hydrocortisone 2.5 % cream Apply 1 application topically 2 (two) times daily as needed (for hives/rash).    [provider]  ibuprofen (ADVIL,MOTRIN) 200 MG tablet Take 200 mg by mouth every 6 (six) hours as needed for headache or moderate pain.    [provider]  pantoprazole (PROTONIX) 40 MG tablet Take 1 tablet by mouth daily. 11/29/20   [provider]   Social History Social History   Tobacco Use   Smoking status: Never   Smokeless tobacco: Never  Vaping Use   Vaping Use: Never used  Substance Use Topics   Alcohol use: Yes    Comment: occ   Drug use: No     Allergies   Gabapentin, Other, and Yeast-related products   Review of Systems Review of Systems Per HPI  Physical Exam Triage Vital Signs ED Triage Vitals  Enc Vitals Group     BP 03/13/21 1315 (!) 83/69     Pulse Rate 03/13/21 1315 73     Resp 03/13/21 1315 18     Temp 03/13/21 1315 98.4 F (36.9 C)     Temp Source 03/13/21 1315 Oral     SpO2 03/13/21 1315 100 %     Weight 03/13/21 1311 240 lb 1.3 oz (108.9 kg)     Height 03/13/21 1311 5\' 10"  (1.778 m)     Head Circumference --      Peak Flow --      Pain Score 03/13/21 1311 0     Pain Loc --      Pain Edu? --      Excl. in [place]? --     Updated Vital Signs BP 102/61 (BP Location: Right Arm)   Pulse 73   Temp 98.4 F (36.9 C) (Oral)   Resp (!) 22   Ht 5\' 10"  (1.778 m)   Wt 108.9 kg   SpO2 98%   BMI 34.45 kg/m   Visual Acuity Right Eye Distance:   Left Eye Distance:   Bilateral Distance:    Right Eye Near:   Left Eye Near:    Bilateral Near:     Physical Exam Constitutional:       Comments: Diaphoretic.  HENT:     Head: Normocephalic and atraumatic.     Mouth/Throat:     Mouth: Mucous membranes are dry.  Eyes:     General:        Right eye: No discharge.  Left eye: No discharge.     Conjunctiva/sclera: Conjunctivae normal.  Cardiovascular:     Rate and Rhythm: Normal rate and regular rhythm.  Pulmonary:     Effort: Pulmonary effort is normal.     Breath sounds: Normal breath sounds. No wheezing, rhonchi or rales.  Abdominal:     General: There is no distension.     Palpations: Abdomen is soft.     Tenderness: There is no abdominal tenderness.  Neurological:     Mental Status: He is alert.  Psychiatric:        Mood and Affect: Mood normal.        Behavior: Behavior normal.     UC Treatments / Results  Labs (all labs ordered are listed, but only abnormal results are displayed) Labs Reviewed  URINALYSIS, COMPLETE (UACMP) WITH MICROSCOPIC - Abnormal; Notable for the following components:      Result Value   APPearance HAZY (*)    Hgb urine dipstick MODERATE (*)    Bilirubin Urine LARGE (*)    Leukocytes,Ua LARGE (*)    Non Squamous Epithelial PRESENT (*)    Bacteria, UA FEW (*)    All other components within normal limits  CBC WITH DIFFERENTIAL/PLATELET - Abnormal; Notable for the following components:   Platelets 130 (*)    Lymphs Abs 0.6 (*)    All other components within normal limits  COMPREHENSIVE METABOLIC PANEL - Abnormal; Notable for the following components:   Sodium 129 (*)    Potassium 3.3 (*)    Chloride 94 (*)    Glucose, Bld 125 (*)    BUN 47 (*)    Creatinine, Ser 1.88 (*)    Calcium 8.3 (*)    Total Protein 6.4 (*)    GFR, Estimated 38 (*)    All other components within normal limits    EKG   Radiology No results found.  Procedures Procedures (including critical care time)  Medications Ordered in UC Medications  sodium chloride 0.9 % bolus 1,000 mL (1,000 mLs Intravenous New Bag/Given 03/13/21 1401)     Initial Impression / Assessment and Plan / UC Course  I have reviewed the triage vital signs and the nursing notes.  Pertinent labs & imaging results that were available during my care of the patient were reviewed by me and considered in my medical decision making (see chart for details).    70 year old male presents with recent nausea and emesis x1.  Now with dysuria and hypotension.  Hypotensive on arrival at 83/69.  Laboratory studies obtained and IV fluids started.  Labs notable for urinalysis and microscopy that is consistent with UTI.  Sending culture.  Clinically, patient appears dry.  Hyponatremia, hypokalemia, and elevated BUN and creatinine noted.  This is consistent with acute kidney injury.  No evidence of leukocytosis.  I am concerned about sepsis.  Given patient's presentation vital signs, and laboratory studies he needs further evaluation and management in the hospital setting.  Needs hospital admission.  Final Clinical Impressions(s) / UC Diagnoses   Final diagnoses:  Urinary tract infection with hematuria, site unspecified  AKI (acute kidney injury) ([place])  Hyponatremia  Hypokalemia  Hypotension, unspecified hypotension type   Discharge Instructions   None    ED Prescriptions   None    PDMP not reviewed this encounter.   Coral Spikes, [place] 03/13/21 1436

## 2021-03-13 NOTE — ED Notes (Signed)
EMS has been called for transport to [facility]

## 2021-03-13 NOTE — ED Notes (Signed)
Patient transported to inpatient unit.  

## 2021-03-13 NOTE — ED Notes (Signed)
Pt shaking, temp 99.6. Verbal order 1g tylenol dr Tamala Julian

## 2021-03-13 NOTE — ED Triage Notes (Addendum)
Pt c/o dysuria, urinary urgency, urinary frequency, subjective fever, sweats, and chills. Started about 3 days ago. He also states his BP has been running lower than normal. He states he had one episode of vomiting when symptoms first started but has since resolved. Pt denies chest pain, shortness of breath. He states he does have some dizziness when he stands up fast.

## 2021-03-13 NOTE — ED Triage Notes (Signed)
Pt to ED [facility] from [facility] UC for UTI x3 days, with hypotension.  Denies fevers Reports burning with urination Alert and oriented.

## 2021-03-13 NOTE — H&P (Signed)
History and Physical    Marc Weaver WUJ:[id number] DOB: Jul 03, 1951 DOA: 03/13/2021  PCP: Adin Hector, MD   Patient coming from: Home  I have personally briefly reviewed patient's old medical records in [place]  Chief Complaint: "I don't feel good"  HPI: Marc Weaver is a 70 y.o. male with medical history significant for hypertension, BPH, GERD who presents to the ER from the urgent care center for evaluation of a 3-day history of nausea, vomiting, urinary incontinence, urgency, frequency, dysuria and chills. His symptoms have progressively worsened over the last 3 days and he went to the urgent care center where they sent him to the ER for further evaluation due to concerns for possible sepsis. Patient complained of feeling dizzy when he arrived and stated that his blood pressure has been running lower than normal. He has had poor oral intake. He denies having any chest pain, no shortness of breath, no abdominal pain, palpitations, no diaphoresis, cough, no blurred vision, no focal deficits, no lower extremity swelling, no diarrhea or constipation. Labs show sodium 132, potassium 3.1, chloride 100, bicarb 25, glucose 117, BUN 42, creatinine 1.61 compared to baseline of 1.1, calcium 7.9, magnesium 2.0, lactic acid 1.0, white count 6.0, hemoglobin 13.6, hematocrit 38, MCV 93.3, RDW 13.1, platelet count 127 CT scan of abdomen and pelvis shows no evidence of nephrolithiasis or obstructive uropathy.3.1 cm right renal cyst. Diffusely scattered colonic diverticulosis without evidence of diverticulitis. 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 Recommend follow-up ultrasound every 2 years.  Twelve-lead EKG reviewed by me shows sinus rhythm  ED Course: Patient is a 70 year old Caucasian male who presents to the ER via EMS for evaluation of a 3-day history of feeling unwell.  He was exposed to his wife who recently  suffered a GI illness and 3 days ago he developed nausea and had an episode of emesis.  Later in the day he developed urinary symptoms which include incontinence, dysuria, frequency and chills which have persisted for the last 3 days.  He had a blood pressure of 83/69 when he arrived at the urgent care center and complained of feeling dizzy so he was sent to the ER for further evaluation. He received 1.5 L fluid bolus by EMS and 1 L of LR in the ER with improvement in his blood pressure. While in the emergency room he developed a fever with a T-max of 103F with associated tachycardia. He will be admitted to the hospital for further evaluation.     Review of Systems: As per HPI otherwise all other systems reviewed and negative.    Past Medical History:  Diagnosis Date   AAA (abdominal aortic aneurysm) ([place])    followed by dr Caryl Comes   Atrial myxoma    removed 2008   Barrett's esophagus    BPH (benign prostatic hyperplasia)    Degenerative disc disease, lumbar    arthritis "all-over" back and knees   GERD (gastroesophageal reflux disease)    no meds   Hypercholesteremia    Hyperglycemia    Hypertension    controlled on meds   Numbness and tingling in left hand    Peyronie disease    Sleep apnea    mild by sleep study, no cpap    Past Surgical History:  Procedure Laterality Date   CARPAL TUNNEL RELEASE Left 02/09/2015   Procedure: CARPAL TUNNEL RELEASE;  Surgeon: Leanor Kail, MD;  Location: [place];  Service:  Orthopedics;  Laterality: Left;   COLONOSCOPY  06/24/2004 AND 07/22/2013   COLONOSCOPY N/A 10/03/2015   Procedure: COLONOSCOPY;  Surgeon: Hulen Luster, MD;  Location: [place];  Service: Gastroenterology;  Laterality: N/A;   ESOPHAGOGASTRODUODENOSCOPY  07/22/2013   HEARTBURN,HEME-POSITIVE STOOL,POSITIVE BARRETT'S   ESOPHAGOGASTRODUODENOSCOPY N/A 10/03/2015   Procedure: ESOPHAGOGASTRODUODENOSCOPY (EGD);  Surgeon: Hulen Luster, MD;  Location: [place];  Service: Gastroenterology;  Laterality: N/A;   EXCISION OF ATRIAL MYXOMA     KNEE ARTHROSCOPY Right 11/26/2012   partial medial menisectomy and medial chondroplasty   LUMBAR LAMINECTOMY  1988   L4,L5,L6   MAKOPLASTY Right 07/28/2013   MEDIAL COMPARTMENT,partial knee replacement   SHOULDER ARTHROSCOPY WITH OPEN ROTATOR CUFF REPAIR Left 09/11/2017   Procedure: SHOULDER ARTHROSCOPY WITH OPEN ROTATOR CUFF REPAIR,BICEPS TENODEDSIS, AND DECOMPRESSION;  Surgeon: Corky Mull, MD;  Location: [facility];  Service: Orthopedics;  Laterality: Left;   TONSILLECTOMY     WITH ADENOIDECTOMY     reports that he has never smoked. He has never used smokeless tobacco. He reports current alcohol use. He reports that he does not use drugs.  Allergies  Allergen Reactions   Gabapentin Other (See Comments)    Makes patient "feel bad"   Other Hives    Nuts   Yeast-Related Products Hives    Family History  Problem Relation Age of Onset   Stomach cancer Father    Aortic aneurysm Brother       Prior to Admission medications   Medication Sig Start Date End Date Taking? Authorizing Provider  acidophilus (RISAQUAD) CAPS capsule Take 1 capsule by mouth daily.   Yes [provider]  aspirin 81 MG tablet Take 81 mg by mouth at bedtime.   Yes [provider]  atorvastatin (LIPITOR) 40 MG tablet Take 40 mg by mouth daily.   Yes [provider]  chlorthalidone (HYGROTON) 25 MG tablet Take 25 mg by mouth daily.   Yes [provider]  cloNIDine (CATAPRES) 0.1 MG tablet Take 0.1 mg by mouth at bedtime.   Yes [provider]  etodolac (LODINE) 500 MG tablet Take 500 mg by mouth daily.   Yes [provider]  losartan (COZAAR) 50 MG tablet Take 50 mg by mouth daily.   Yes [provider]  pantoprazole (PROTONIX) 40 MG tablet Take 40 mg by mouth daily.   Yes [provider]  traZODone (DESYREL) 50 MG tablet Take 50 mg by mouth at bedtime.  07/24/17  Yes [provider]    Physical Exam: Vitals:   03/13/21 1739 03/13/21 1745 03/13/21 1803 03/13/21 1827  BP:   (!) 145/97   Pulse:  (!) 107 (!) 114   Resp:  (!) 27 (!) 32   Temp: 99.6 F (37.6 C)   (!) 103 F (39.4 C)  TempSrc: Oral   Oral  SpO2:  90% 97%   Weight:      Height:         Vitals:   03/13/21 1739 03/13/21 1745 03/13/21 1803 03/13/21 1827  BP:   (!) 145/97   Pulse:  (!) 107 (!) 114   Resp:  (!) 27 (!) 32   Temp: 99.6 F (37.6 C)   (!) 103 F (39.4 C)  TempSrc: Oral   Oral  SpO2:  90% 97%   Weight:      Height:          Constitutional: Alert and oriented x 3 . Not in any apparent  distress HEENT:      Head: Normocephalic and atraumatic.         Eyes: PERLA, EOMI, Conjunctivae are normal. Sclera is non-icteric.       Mouth/Throat: Mucous membranes are dry.       Neck: Supple with no signs of meningismus. Cardiovascular: Regular rate and rhythm. No murmurs, gallops, or rubs. 2+ symmetrical distal pulses are present . No JVD. No LE edema Respiratory: Respiratory effort normal .Lungs sounds clear bilaterally. No wheezes, crackles, or rhonchi.  Gastrointestinal: Soft, non tender, and non distended with positive bowel sounds.  Genitourinary: No CVA tenderness. Musculoskeletal: Nontender with normal range of motion in all extremities. No cyanosis, or erythema of extremities. Neurologic:  Face is symmetric. Moving all extremities. No gross focal neurologic deficits . Skin: Skin is warm, dry.  No rash or ulcers Psychiatric: Mood and affect are normal    Labs on Admission: I have personally reviewed following labs and imaging studies  CBC: Recent Labs  Lab 03/13/21 1327 03/13/21 1510  WBC 6.5 6.0  NEUTROABS 5.1 4.6  HGB 13.9 13.6  HCT 39.7 38.8*  MCV 91.5 93.3  PLT 130* 270*   Basic Metabolic Panel: Recent Labs  Lab 03/13/21 1327 03/13/21 1510  NA 129* 132*  K 3.3* 3.1*  CL 94* 100  CO2 27 25  GLUCOSE 125* 117*  BUN 47* 42*   CREATININE 1.88* 1.61*  CALCIUM 8.3* 7.9*  MG  --  2.0   GFR: Estimated Creatinine Clearance: 52.2 mL/min (A) (by C-G formula based on SCr of 1.61 mg/dL (H)). Liver Function Tests: Recent Labs  Lab 03/13/21 1327  AST 33  ALT 32  ALKPHOS 79  BILITOT 1.1  PROT 6.4*  ALBUMIN 3.6   No results for input(s): LIPASE, AMYLASE in the last 168 hours. No results for input(s): AMMONIA in the last 168 hours. Coagulation Profile: No results for input(s): INR, PROTIME in the last 168 hours. Cardiac Enzymes: No results for input(s): CKTOTAL, CKMB, CKMBINDEX, TROPONINI in the last 168 hours. BNP (last 3 results) No results for input(s): PROBNP in the last 8760 hours. HbA1C: No results for input(s): HGBA1C in the last 72 hours. CBG: No results for input(s): GLUCAP in the last 168 hours. Lipid Profile: No results for input(s): CHOL, HDL, LDLCALC, TRIG, CHOLHDL, LDLDIRECT in the last 72 hours. Thyroid Function Tests: No results for input(s): TSH, T4TOTAL, FREET4, T3FREE, THYROIDAB in the last 72 hours. Anemia Panel: No results for input(s): VITAMINB12, FOLATE, FERRITIN, TIBC, IRON, RETICCTPCT in the last 72 hours. Urine analysis:    Component Value Date/Time   COLORURINE YELLOW 03/13/2021 1318   APPEARANCEUR HAZY (A) 03/13/2021 1318   LABSPEC 1.010 03/13/2021 1318   PHURINE 5.5 03/13/2021 1318   GLUCOSEU NEGATIVE 03/13/2021 1318   HGBUR MODERATE (A) 03/13/2021 1318   BILIRUBINUR LARGE (A) 03/13/2021 1318   KETONESUR NEGATIVE 03/13/2021 1318   PROTEINUR NEGATIVE 03/13/2021 1318   NITRITE NEGATIVE 03/13/2021 1318   LEUKOCYTESUR LARGE (A) 03/13/2021 1318    Radiological Exams on Admission: CT ABDOMEN PELVIS WO CONTRAST  Result Date: 03/13/2021 CLINICAL DATA:  UTI. EXAM: CT ABDOMEN AND PELVIS WITHOUT CONTRAST TECHNIQUE: Multidetector CT imaging of the abdomen and pelvis was performed following the standard protocol without IV contrast. COMPARISON:  None. FINDINGS: Lower chest: No  acute abnormality. Hepatobiliary: No focal liver abnormality is seen. No gallstones, gallbladder wall thickening, or biliary dilatation. Pancreas: Unremarkable. No pancreatic ductal dilatation or surrounding inflammatory changes. Spleen: Normal in size without focal abnormality.  Adrenals/Urinary Tract: No evidence of hydronephrosis or nephrolithiasis. 3.1 cm right renal cyst. Normal urinary bladder. Normal adrenal glands. Stomach/Bowel: Stomach is within normal limits. No evidence of appendicitis. No evidence of bowel wall thickening, distention, or inflammatory changes. Diffusely scattered colonic diverticulosis, particularly of the sigmoid colon without evidence of diverticulitis. Vascular/Lymphatic: Tortuosity and calcific atherosclerotic disease of the aorta. 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 Reproductive: Prostate is unremarkable. Other: No abdominal wall hernia or abnormality. No abdominopelvic ascites. Musculoskeletal: Spondylosis of the spine. IMPRESSION: 1. No evidence of nephrolithiasis or obstructive uropathy. 2. 3.1 cm right renal cyst. 3. Diffusely scattered colonic diverticulosis without evidence of diverticulitis. 4. 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 Recommend follow-up ultrasound every 2 years. This recommendation follows ACR consensus guidelines: White Paper of the ACR Incidental Findings Committee II on Vascular Findings. J Am Coll Radiol 2013; 10:789-794. Aortic Atherosclerosis (ICD10-I70.0). Electronically Signed   By: Fidela Salisbury M.D.   On: 03/13/2021 16:19     Assessment/Plan Principal Problem:   Sepsis ([address]) Active Problems:   UTI (urinary tract infection)   Hypertension   AKI (acute kidney injury) ([place])     Sepsis As evidenced by fever with a T-max of 103, tachycardia, tachypnea, hypotension that resolved with IV  fluid resuscitation as well as pyuria. Patient noted to have worsening of his renal function from baseline, at baseline his serum creatinine is 1.0 and on admission today it is 1.8 He received 2.5 L IV fluid bolus with improvement in his blood pressure Continue IV fluid resuscitation Treat patient empirically with Rocephin 1 g IV daily until urine culture results become available.     Acute kidney injury Most likely secondary to ATN from hypotension Hold losartan and chlorthalidone Continue IV fluid resuscitation Repeat renal parameters in a.m.    DVT prophylaxis: Lovenox  Code Status: full code  Family Communication: Greater than 50% of time spent discussing patient's condition and plan of care with him and his wife at the bedside.  All questions and concerns have been addressed.  They verbalized understanding and agree with the plan. Disposition Plan: Back to previous home environment Consults called: none  Status: At the time of admission, it appears that the appropriate admission status for this patient is inpatient. This is judged to be reasonable and necessary in order to provide the required intensity of service to ensure the patient's safety given the presenting symptoms, physical exam findings, and initial radiographic and laboratory data in the context of their comorbid conditions. Patient with due to high intensity of service, high risk for further deterioration and high frequency of surveillance required.    Collier Bullock MD [facility]     03/13/2021, 7:29 PM

## 2021-03-13 NOTE — Progress Notes (Signed)
Lab reports critical lactic of 2.3. Provider notified. No orders at this time

## 2021-03-14 ENCOUNTER — Encounter: Payer: Self-pay | Admitting: Internal Medicine

## 2021-03-14 DIAGNOSIS — N3000 Acute cystitis without hematuria: Secondary | ICD-10-CM

## 2021-03-14 LAB — BASIC METABOLIC PANEL
Anion gap: 9 (ref 5–15)
BUN: 26 mg/dL — ABNORMAL HIGH (ref 8–23)
CO2: 27 mmol/L (ref 22–32)
Calcium: 8.6 mg/dL — ABNORMAL LOW (ref 8.9–10.3)
Chloride: 99 mmol/L (ref 98–111)
Creatinine, Ser: 1.35 mg/dL — ABNORMAL HIGH (ref 0.61–1.24)
GFR, Estimated: 56 mL/min — ABNORMAL LOW (ref >60)
Glucose, Bld: 126 mg/dL — ABNORMAL HIGH (ref 70–99)
Potassium: 3.7 mmol/L (ref 3.5–5.1)
Sodium: 135 mmol/L (ref 135–145)

## 2021-03-14 LAB — LACTIC ACID, PLASMA: Lactic Acid, Venous: 1.9 mmol/L (ref 0.5–1.9)

## 2021-03-14 LAB — CBC
HCT: 43.4 % (ref 39.0–52.0)
Hemoglobin: 14.8 g/dL (ref 13.0–17.0)
MCH: 32 pg (ref 26.0–34.0)
MCHC: 34.1 g/dL (ref 30.0–36.0)
MCV: 93.9 fL (ref 80.0–100.0)
Platelets: 133 10*3/uL — ABNORMAL LOW (ref 150–400)
RBC: 4.62 MIL/uL (ref 4.22–5.81)
RDW: 13.2 % (ref 11.5–15.5)
WBC: 12 10*3/uL — ABNORMAL HIGH (ref 4.0–10.5)
nRBC: 0 % (ref 0.0–0.2)

## 2021-03-14 LAB — SARS CORONAVIRUS 2 (TAT 6-24 HRS): SARS Coronavirus 2: NEGATIVE

## 2021-03-14 LAB — HIV ANTIBODY (ROUTINE TESTING W REFLEX): HIV Screen 4th Generation wRfx: NONREACTIVE

## 2021-03-14 LAB — CORTISOL-AM, BLOOD: Cortisol - AM: 22 ug/dL (ref 6.7–22.6)

## 2021-03-14 LAB — PROCALCITONIN: Procalcitonin: 2.21 ng/mL

## 2021-03-14 LAB — PROTIME-INR
INR: 1.3 — ABNORMAL HIGH (ref 0.8–1.2)
Prothrombin Time: 16.6 seconds — ABNORMAL HIGH (ref 11.4–15.2)

## 2021-03-14 MED ORDER — SODIUM CHLORIDE 0.9 % IV SOLN
2.0000 g | INTRAVENOUS | Status: DC
  Administered 2021-03-15: 2 g via INTRAVENOUS
  Filled 2021-03-14: qty 20
  Filled 2021-03-14: qty 2

## 2021-03-14 NOTE — Progress Notes (Signed)
PROGRESS NOTE    Marc Weaver  IOM:[id number] DOB: [birth year] DOA: 03/13/2021 PCP: Adin Hector, MD   Brief Narrative:  70 y.o. male with medical history significant for hypertension, BPH, GERD who presents to the ER from the urgent care center for evaluation of a 3-day history of nausea, vomiting, urinary incontinence, urgency, frequency, dysuria and chills. His symptoms have progressively worsened over the last 3 days and he went to the urgent care center where they sent him to the ER for further evaluation due to concerns for possible sepsis. Patient complained of feeling dizzy when he arrived and stated that his blood pressure has been running lower than normal. He has had poor oral intake.  Started on aggressive intravenous fluids and IV Rocephin.  Sepsis physiology improving.  Reports good appetite   Assessment & Plan:   Principal Problem:   Sepsis ([place]) Active Problems:   UTI (urinary tract infection)   Hypertension   AKI (acute kidney injury) ([place])  Complicated UTI in a male Severe sepsis secondary to above Sepsis defended by fever, tachycardia, tachypnea, elevated lactic acid Hypotension did resolve with IV fluids Urinalysis consistent with infection however CT imaging survey negative for cystitis or pyelonephritis Plan: Continue IV fluid resuscitation Continue IV Rocephin Monitor blood and urine cultures  Acute kidney injury Prerenal azotemia versus acute tubular necrosis Improving on IV fluids Plan: Hold losartan and chlorthalidone Continue IV fluid resuscitation Repeat renal parameters in a.m.   DVT prophylaxis: SQ Lovenox Code Status: Full Family Communication: Wife at bedside Disposition Plan: Status is: Inpatient  Remains inpatient appropriate because:Inpatient level of care appropriate due to severity of illness  Dispo: The patient is from: Home              Anticipated d/c is to: Home              Patient currently is not medically stable to  d/c.   Difficult to place patient No Patient with UTI and resultant sepsis.  No other obvious sources of infection.  Seems to be improving on intravenous fluids and IV Rocephin.  Possible disposition in 24 hours.      Level of care: Med-Surg  Consultants:  Infectious disease  Procedures:  None  Antimicrobials:  Ceftriaxone   Subjective: Patient seen and examined.  Reports marked improvement in symptoms since admission.  Objective: Vitals:   03/14/21 0208 03/14/21 0314 03/14/21 0741 03/14/21 1128  BP: (!) 100/58 95/63 113/68 (!) 95/58  Pulse: 84 79 (!) 102 73  Resp:  19 20 19   Temp: 99.8 F (37.7 C) 98.9 F (37.2 C) 99.8 F (37.7 C) 99.4 F (37.4 C)  TempSrc: Oral Oral Oral Oral  SpO2: 98% 96% 98% 97%  Weight:      Height:        Intake/Output Summary (Last 24 hours) at 03/14/2021 1309 Last data filed at 03/14/2021 1035 Gross per 24 hour  Intake 3185.87 ml  Output 1650 ml  Net 1535.87 ml   Filed Weights   03/13/21 1510  Weight: 103 kg    Examination:  General exam: Appears calm and comfortable  Respiratory system: Clear to auscultation. Respiratory effort normal. Cardiovascular system: S1 & S2 heard, RRR. No JVD, murmurs, rubs, gallops or clicks. No pedal edema. Gastrointestinal system: Abdomen is nondistended, soft and nontender. No organomegaly or masses felt. Normal bowel sounds heard. Central nervous system: Alert and oriented. No focal neurological deficits. Extremities: Symmetric 5 x 5 power. Skin: No rashes, lesions or ulcers  Psychiatry: Judgement and insight appear normal. Mood & affect appropriate.     Data Reviewed: I have personally reviewed following labs and imaging studies  CBC: Recent Labs  Lab 03/13/21 1327 03/13/21 1510 03/14/21 0438  WBC 6.5 6.0 12.0*  NEUTROABS 5.1 4.6  --   HGB 13.9 13.6 14.8  HCT 39.7 38.8* 43.4  MCV 91.5 93.3 93.9  PLT 130* 127* 478*   Basic Metabolic Panel: Recent Labs  Lab 03/13/21 1327  03/13/21 1510 03/14/21 0438  NA 129* 132* 135  K 3.3* 3.1* 3.7  CL 94* 100 99  CO2 27 25 27   GLUCOSE 125* 117* 126*  BUN 47* 42* 26*  CREATININE 1.88* 1.61* 1.35*  CALCIUM 8.3* 7.9* 8.6*  MG  --  2.0  --    GFR: Estimated Creatinine Clearance: 62.2 mL/min (A) (by C-G formula based on SCr of 1.35 mg/dL (H)). Liver Function Tests: Recent Labs  Lab 03/13/21 1327  AST 33  ALT 32  ALKPHOS 79  BILITOT 1.1  PROT 6.4*  ALBUMIN 3.6   No results for input(s): LIPASE, AMYLASE in the last 168 hours. No results for input(s): AMMONIA in the last 168 hours. Coagulation Profile: Recent Labs  Lab 03/14/21 0438  INR 1.3*   Cardiac Enzymes: No results for input(s): CKTOTAL, CKMB, CKMBINDEX, TROPONINI in the last 168 hours. BNP (last 3 results) No results for input(s): PROBNP in the last 8760 hours. HbA1C: No results for input(s): HGBA1C in the last 72 hours. CBG: No results for input(s): GLUCAP in the last 168 hours. Lipid Profile: No results for input(s): CHOL, HDL, LDLCALC, TRIG, CHOLHDL, LDLDIRECT in the last 72 hours. Thyroid Function Tests: No results for input(s): TSH, T4TOTAL, FREET4, T3FREE, THYROIDAB in the last 72 hours. Anemia Panel: No results for input(s): VITAMINB12, FOLATE, FERRITIN, TIBC, IRON, RETICCTPCT in the last 72 hours. Sepsis Labs: Recent Labs  Lab 03/13/21 1510 03/13/21 1911 03/13/21 2220 03/14/21 0438  PROCALCITON  --   --   --  2.21  LATICACIDVEN 1.0 1.1 2.3* 1.9    Recent Results (from the past 240 hour(s))  Blood culture (routine x 2)     Status: None (Preliminary result)   Collection Time: 03/13/21  3:50 PM   Specimen: BLOOD  Result Value Ref Range Status   Specimen Description BLOOD BLOOD LEFT HAND  Final   Special Requests   Final    BOTTLES DRAWN AEROBIC ONLY Blood Culture results may not be optimal due to an inadequate volume of blood received in culture bottles   Culture   Final    NO GROWTH < 24 HOURS Performed at [facility], [address] [place], [place] [address]    Report Status PENDING  Incomplete  Blood culture (routine x 2)     Status: None (Preliminary result)   Collection Time: 03/13/21  3:50 PM   Specimen: BLOOD  Result Value Ref Range Status   Specimen Description BLOOD RIGHT ANTECUBITAL  Final   Special Requests   Final    BOTTLES DRAWN AEROBIC AND ANAEROBIC Blood Culture adequate volume   Culture   Final    NO GROWTH < 24 HOURS Performed at [facility], [address] [place], [place] [address]    Report Status PENDING  Incomplete  SARS CORONAVIRUS 2 (TAT 6-24 HRS) Nasopharyngeal Nasopharyngeal Swab     Status: None   Collection Time: 03/13/21  6:06 PM   Specimen: Nasopharyngeal Swab  Result Value Ref Range Status  SARS Coronavirus 2 NEGATIVE NEGATIVE Final    Comment: (NOTE) SARS-CoV-2 target nucleic acids are NOT DETECTED.  The SARS-CoV-2 RNA is generally detectable in upper and lower respiratory specimens during the acute phase of infection. Negative results do not preclude SARS-CoV-2 infection, do not rule out co-infections with other pathogens, and should not be used as the sole basis for treatment or other patient management decisions. Negative results must be combined with clinical observations, patient history, and epidemiological information. The expected result is Negative.  Fact Sheet for Patients: SugarRoll.be  Fact Sheet for Healthcare Providers: https://www.woods-mathews.com/  This test is not yet approved or cleared by the [place] FDA and  has been authorized for detection and/or diagnosis of SARS-CoV-2 by FDA under an Emergency Use Authorization (EUA). This EUA will remain  in effect (meaning this test can be used) for the duration of the COVID-19 declaration under Se ction 564(b)(1) of the Act, 21 U.S.C. section 360bbb-3(b)(1), unless the authorization is terminated or revoked sooner.  Performed  at [facility], [place] [address], [place], [place] [address]          Radiology Studies: CT ABDOMEN PELVIS WO CONTRAST  Result Date: 03/13/2021 CLINICAL DATA:  UTI. EXAM: CT ABDOMEN AND PELVIS WITHOUT CONTRAST TECHNIQUE: Multidetector CT imaging of the abdomen and pelvis was performed following the standard protocol without IV contrast. COMPARISON:  None. FINDINGS: Lower chest: No acute abnormality. Hepatobiliary: No focal liver abnormality is seen. No gallstones, gallbladder wall thickening, or biliary dilatation. Pancreas: Unremarkable. No pancreatic ductal dilatation or surrounding inflammatory changes. Spleen: Normal in size without focal abnormality. Adrenals/Urinary Tract: No evidence of hydronephrosis or nephrolithiasis. 3.1 cm right renal cyst. Normal urinary bladder. Normal adrenal glands. Stomach/Bowel: Stomach is within normal limits. No evidence of appendicitis. No evidence of bowel wall thickening, distention, or inflammatory changes. Diffusely scattered colonic diverticulosis, particularly of the sigmoid colon without evidence of diverticulitis. Vascular/Lymphatic: Tortuosity and calcific atherosclerotic disease of the aorta. 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 Reproductive: Prostate is unremarkable. Other: No abdominal wall hernia or abnormality. No abdominopelvic ascites. Musculoskeletal: Spondylosis of the spine. IMPRESSION: 1. No evidence of nephrolithiasis or obstructive uropathy. 2. 3.1 cm right renal cyst. 3. Diffusely scattered colonic diverticulosis without evidence of diverticulitis. 4. 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 Recommend follow-up ultrasound every 2 years. This recommendation follows ACR consensus guidelines: White Paper of the ACR Incidental Findings Committee II on Vascular Findings. J Am Coll Radiol 2013;  10:789-794. Aortic Atherosclerosis (ICD10-I70.0). Electronically Signed   By: Fidela Salisbury M.D.   On: 03/13/2021 16:19        Scheduled Meds:  acidophilus  1 capsule Oral Daily   aspirin EC  81 mg Oral QHS   atorvastatin  40 mg Oral Daily   cloNIDine  0.1 mg Oral QHS   enoxaparin (LOVENOX) injection  50 mg Subcutaneous Q24H   pantoprazole  40 mg Oral Daily   traZODone  50 mg Oral QHS   Continuous Infusions:  cefTRIAXone (ROCEPHIN)  IV     lactated ringers with kcl 150 mL/hr at 03/14/21 1035     LOS: 1 day    Time spent: 25 minutes    Sidney Ace, MD [facility] Pager [phone]  If 7PM-7AM, please contact night-coverage 03/14/2021, 1:09 PM

## 2021-03-14 NOTE — Progress Notes (Signed)
   03/14/21 0007  Assess: MEWS Score  Temp (!) 102.3 F (39.1 C)  BP (!) 124/56  Pulse Rate (!) 104  Resp (!) 31  SpO2 96 %  O2 Device Room Air  Assess: MEWS Score  MEWS Temp 2  MEWS Systolic 0  MEWS Pulse 1  MEWS RR 2  MEWS LOC 0  MEWS Score 5  MEWS Score Color Red  Assess: if the MEWS score is Yellow or Red  Were vital signs taken at a resting state? Yes  Focused Assessment Change from prior assessment (see assessment flowsheet)  Early Detection of Sepsis Score *See Row Information* Low  MEWS guidelines implemented *See Row Information* Yes  Treat  MEWS Interventions Escalated (See documentation below)  Pain Scale 0-10  Pain Score 0  Escalate  MEWS: Escalate Red: discuss with charge nurse/RN and provider, consider discussing with RRT  Notify: Charge Nurse/RN  Name of Charge Nurse/RN Notified Stacy,RN  Date Charge Nurse/RN Notified 03/14/21  Time Charge Nurse/RN Notified 0010  Notify: Provider  Provider Name/Title Dr. Claud Kelp  Date Provider Notified 03/14/21  Time Provider Notified 0010  Notification Type Page  Notification Reason Change in status;Critical result  Provider response See new orders  Date of Provider Response 03/14/21  Time of Provider Response 0013  Document  Patient Outcome Stabilized after interventions  Progress note created (see row info) Yes

## 2021-03-14 NOTE — Progress Notes (Signed)
Spoke with wife to update her on the status of the patient. Patient recently had a red mews score that has now subsided. Wife is updated that the patient is now stable and resting in bed with vitals being taken hourly at this time.

## 2021-03-14 NOTE — Consult Note (Signed)
NAME: Marc Weaver  DOB: 10-Jan-1951  MRN: [id number]  Date/Time: 03/14/2021 2:54 PM  REQUESTING PROVIDER: Dr. Drue Flirt Subjective:  REASON FOR CONSULT: Complicated UTI ? Marc Weaver is a 70 y.o. male with a history of operated atrial myxoma, hypercholesterolemia, , hypertension scented to urgent care on 03/13/2021 with urgency, frequency, fever, sweats and chills and dysuria.  He was sent to [facility] ED as blood pressure was 83/69.  His temperature in the urgent care was 98.4, heart rate 73 and sats 100%. In our ED his vitals were temperature of 99.6, heart rate of 107, respiratory rate 27 and BP 102 61. As per patient he has been unwell for the past 3 days. He says his wife started with diarrhea and vomiting over the weekend and was thought to have food poisoning and went to urgent care and was given Zofran. He had a episode of vomiting but started with dysuria and frequency and urgency.  He also was feeling fever and chills intermittently the whole day and hence went to urgent care and was sent to the hospital.  Patient denies any prior symptoms related to a urinary tract infection or BPH. His labs on admission revealed a WBC of 6, Hb of 13.6 and platelet of 127.  Creatinine was 1. 88.  Baseline creatinine was 1.1 on 03/08/2021. UA examination on 03/08/2021 was normal. UA examination in the hospital showed more than 50 WBC and moderate hemoglobin. Blood culture and urine culture was sent and he was started on IV ceftriaxone. Asked to see the patient for complicated UTI  Past Medical History:  Diagnosis Date   AAA (abdominal aortic aneurysm) ([place])    followed by dr Caryl Comes   Atrial myxoma    removed 2008   Barrett's esophagus    BPH (benign prostatic hyperplasia)    Degenerative disc disease, lumbar    arthritis "all-over" back and knees   GERD (gastroesophageal reflux disease)    no meds   Hypercholesteremia    Hyperglycemia    Hypertension    controlled on meds   Numbness and  tingling in left hand    Peyronie disease    Sleep apnea    mild by sleep study, no cpap    Past Surgical History:  Procedure Laterality Date   CARPAL TUNNEL RELEASE Left 02/09/2015   Procedure: CARPAL TUNNEL RELEASE;  Surgeon: Leanor Kail, MD;  Location: [facility];  Service: Orthopedics;  Laterality: Left;   COLONOSCOPY  06/24/2004 AND 07/22/2013   COLONOSCOPY N/A 10/03/2015   Procedure: COLONOSCOPY;  Surgeon: Hulen Luster, MD;  Location: [facility];  Service: Gastroenterology;  Laterality: N/A;   ESOPHAGOGASTRODUODENOSCOPY  07/22/2013   HEARTBURN,HEME-POSITIVE STOOL,POSITIVE BARRETT'S   ESOPHAGOGASTRODUODENOSCOPY N/A 10/03/2015   Procedure: ESOPHAGOGASTRODUODENOSCOPY (EGD);  Surgeon: Hulen Luster, MD;  Location: [facility];  Service: Gastroenterology;  Laterality: N/A;   EXCISION OF ATRIAL MYXOMA     KNEE ARTHROSCOPY Right 11/26/2012   partial medial menisectomy and medial chondroplasty   LUMBAR LAMINECTOMY  1988   L4,L5,L6   MAKOPLASTY Right 07/28/2013   MEDIAL COMPARTMENT,partial knee replacement   SHOULDER ARTHROSCOPY WITH OPEN ROTATOR CUFF REPAIR Left 09/11/2017   Procedure: SHOULDER ARTHROSCOPY WITH OPEN ROTATOR CUFF REPAIR,BICEPS TENODEDSIS, AND DECOMPRESSION;  Surgeon: Corky Mull, MD;  Location: [facility];  Service: Orthopedics;  Laterality: Left;   TONSILLECTOMY     WITH ADENOIDECTOMY    Social History   Socioeconomic History   Marital status: Married    Spouse name: Not on  file   Number of children: Not on file   Years of education: Not on file   Highest education level: Not on file  Occupational History   Not on file  Tobacco Use   Smoking status: Never   Smokeless tobacco: Never  Vaping Use   Vaping Use: Never used  Substance and Sexual Activity   Alcohol use: Yes    Comment: occ   Drug use: No   Sexual activity: Not on file  Other Topics Concern   Not on file  Social History Narrative   Not on file   Social Determinants of  Health   Financial Resource Strain: Not on file  Food Insecurity: Not on file  Transportation Needs: Not on file  Physical Activity: Not on file  Stress: Not on file  Social Connections: Not on file  Intimate Partner Violence: Not on file    Family History  Problem Relation Age of Onset   Stomach cancer Father    Aortic aneurysm Brother    Allergies  Allergen Reactions   Gabapentin Other (See Comments)    Makes patient "feel bad"   Other Hives    Nuts   Yeast-Related Products Hives   I? Current Facility-Administered Medications  Medication Dose Route Frequency Provider Last Rate Last Admin   acetaminophen (TYLENOL) tablet 650 mg  650 mg Oral Q6H PRN Agbata, Tochukwu, MD   650 mg at 03/14/21 0936   Or   acetaminophen (TYLENOL) suppository 650 mg  650 mg Rectal Q6H PRN Agbata, Tochukwu, MD       acidophilus (RISAQUAD) capsule 1 capsule  1 capsule Oral Daily Agbata, Tochukwu, MD   1 capsule at 03/14/21 0936   aspirin EC tablet 81 mg  81 mg Oral QHS Agbata, Tochukwu, MD   81 mg at 03/13/21 2212   atorvastatin (LIPITOR) tablet 40 mg  40 mg Oral Daily Agbata, Tochukwu, MD   40 mg at 03/14/21 0935   cefTRIAXone (ROCEPHIN) 1 g in sodium chloride 0.9 % 100 mL IVPB  1 g Intravenous Q24H Agbata, Tochukwu, MD 200 mL/hr at 03/14/21 1406 1 g at 03/14/21 1406   cloNIDine (CATAPRES) tablet 0.1 mg  0.1 mg Oral QHS Agbata, Tochukwu, MD   0.1 mg at 03/13/21 2212   enoxaparin (LOVENOX) injection 50 mg  50 mg Subcutaneous Q24H Agbata, Tochukwu, MD   50 mg at 03/13/21 2212   lactated ringers 1,000 mL with potassium chloride 40 mEq infusion   Intravenous Continuous Ralene Muskrat B, MD 100 mL/hr at 03/14/21 1402 New Bag at 03/14/21 1402   ondansetron (ZOFRAN) tablet 4 mg  4 mg Oral Q6H PRN Agbata, Tochukwu, MD       Or   ondansetron (ZOFRAN) injection 4 mg  4 mg Intravenous Q6H PRN Agbata, Tochukwu, MD       pantoprazole (PROTONIX) EC tablet 40 mg  40 mg Oral Daily Agbata, Tochukwu, MD   40 mg at  03/14/21 0936   traZODone (DESYREL) tablet 50 mg  50 mg Oral QHS Agbata, Tochukwu, MD   50 mg at 03/13/21 2212     Abtx:  Anti-infectives (From admission, onward)    Start     Dose/Rate Route Frequency Ordered Stop   03/14/21 1500  cefTRIAXone (ROCEPHIN) 1 g in sodium chloride 0.9 % 100 mL IVPB        1 g 200 mL/hr over 30 Minutes Intravenous Every 24 hours 03/13/21 1911     03/13/21 1530  cefTRIAXone (ROCEPHIN) 1 g  in sodium chloride 0.9 % 100 mL IVPB        1 g 200 mL/hr over 30 Minutes Intravenous  Once 03/13/21 1527 03/13/21 1805       REVIEW OF SYSTEMS:  Const: Subjective fever,  chills, negative weight loss Eyes: negative diplopia or visual changes, negative eye pain ENT: negative coryza, negative sore throat Resp: negative cough, hemoptysis, dyspnea Cards: negative for chest pain, palpitations, lower extremity edema GU:  frequency, dysuria and no hematuria GI: Negative for abdominal pain, diarrhea, bleeding, constipation Skin: negative for rash and pruritus Heme: negative for easy bruising and gum/nose bleeding MS: Generalized weakness Neurolo: Feeling dizzy Psych: negative for feelings of anxiety, depression  Endocrine: negative for thyroid, diabetes Allergy/Immunology- negative for any medication or food allergies ? : Objective:  VITALS:  BP (!) 95/58 (BP Location: Left Arm) Comment: rn is aware  Pulse 73   Temp 99.4 F (37.4 C) (Oral)   Resp 19   Ht 5\' 11"  (1.803 m)   Wt 103 kg   SpO2 97%   BMI 31.66 kg/m  PHYSICAL EXAM:  General: Alert, cooperative, no distress, appears stated age.  Head: Normocephalic, without obvious abnormality, atraumatic. Eyes: Conjunctivae clear, anicteric sclerae. Pupils are equal ENT Nares normal. No drainage or sinus tenderness. Lips, mucosa, and tongue normal. No Thrush Neck: Supple, symmetrical, no adenopathy, thyroid: non tender no carotid bruit and no JVD. Back: No CVA tenderness. Lungs: Clear to auscultation bilaterally.  No Wheezing or Rhonchi. No rales. Heart: Regular rate and rhythm, no murmur, rub or gallop. Abdomen: Soft, non-tender,not distended. Bowel sounds normal. No masses Extremities: atraumatic, no cyanosis. No edema. No clubbing Skin: No rashes or lesions. Or bruising Lymph: Cervical, supraclavicular normal. Neurologic: Grossly non-focal Pertinent Labs Lab Results CBC    Component Value Date/Time   WBC 12.0 (H) 03/14/2021 0438   RBC 4.62 03/14/2021 0438   HGB 14.8 03/14/2021 0438   HCT 43.4 03/14/2021 0438   PLT 133 (L) 03/14/2021 0438   MCV 93.9 03/14/2021 0438   MCH 32.0 03/14/2021 0438   MCHC 34.1 03/14/2021 0438   RDW 13.2 03/14/2021 0438   LYMPHSABS 0.7 03/13/2021 1510   MONOABS 0.7 03/13/2021 1510   EOSABS 0.0 03/13/2021 1510   BASOSABS 0.0 03/13/2021 1510    CMP Latest Ref Rng & Units 03/14/2021 03/13/2021 03/13/2021  Glucose 70 - 99 mg/dL 126(H) 117(H) 125(H)  BUN 8 - 23 mg/dL 26(H) 42(H) 47(H)  Creatinine 0.61 - 1.24 mg/dL 1.35(H) 1.61(H) 1.88(H)  Sodium 135 - 145 mmol/L 135 132(L) 129(L)  Potassium 3.5 - 5.1 mmol/L 3.7 3.1(L) 3.3(L)  Chloride 98 - 111 mmol/L 99 100 94(L)  CO2 22 - 32 mmol/L 27 25 27   Calcium 8.9 - 10.3 mg/dL 8.6(L) 7.9(L) 8.3(L)  Total Protein 6.5 - 8.1 g/dL - - 6.4(L)  Total Bilirubin 0.3 - 1.2 mg/dL - - 1.1  Alkaline Phos 38 - 126 U/L - - 79  AST 15 - 41 U/L - - 33  ALT 0 - 44 U/L - - 32      Microbiology: Recent Results (from the past 240 hour(s))  Blood culture (routine x 2)     Status: None (Preliminary result)   Collection Time: 03/13/21  3:50 PM   Specimen: BLOOD  Result Value Ref Range Status   Specimen Description BLOOD BLOOD LEFT HAND  Final   Special Requests   Final    BOTTLES DRAWN AEROBIC ONLY Blood Culture results may not be optimal due to an  inadequate volume of blood received in culture bottles   Culture   Final    NO GROWTH < 24 HOURS Performed at [facility], [address] [place], [place] [address]    Report  Status PENDING  Incomplete  Blood culture (routine x 2)     Status: None (Preliminary result)   Collection Time: 03/13/21  3:50 PM   Specimen: BLOOD  Result Value Ref Range Status   Specimen Description BLOOD RIGHT ANTECUBITAL  Final   Special Requests   Final    BOTTLES DRAWN AEROBIC AND ANAEROBIC Blood Culture adequate volume   Culture   Final    NO GROWTH < 24 HOURS Performed at [facility], [place], [place] [address]    Report Status PENDING  Incomplete  SARS CORONAVIRUS 2 (TAT 6-24 HRS) Nasopharyngeal Nasopharyngeal Swab     Status: None   Collection Time: 03/13/21  6:06 PM   Specimen: Nasopharyngeal Swab  Result Value Ref Range Status   SARS Coronavirus 2 NEGATIVE NEGATIVE Final    Comment: (NOTE) SARS-CoV-2 target nucleic acids are NOT DETECTED.  The SARS-CoV-2 RNA is generally detectable in upper and lower respiratory specimens during the acute phase of infection. Negative results do not preclude SARS-CoV-2 infection, do not rule out co-infections with other pathogens, and should not be used as the sole basis for treatment or other patient management decisions. Negative results must be combined with clinical observations, patient history, and epidemiological information. The expected result is Negative.  Fact Sheet for Patients: SugarRoll.be  Fact Sheet for Healthcare Providers: https://www.woods-mathews.com/  This test is not yet approved or cleared by the [place] FDA and  has been authorized for detection and/or diagnosis of SARS-CoV-2 by FDA under an Emergency Use Authorization (EUA). This EUA will remain  in effect (meaning this test can be used) for the duration of the COVID-19 declaration under Se ction 564(b)(1) of the Act, 21 U.S.C. section 360bbb-3(b)(1), unless the authorization is terminated or revoked sooner.  Performed at [facility], [place] [address], [place],  [place] [address]     IMAGING RESULTS: No evidence of nephrolithiasis or obstructive uropathy.Prostate is unremarkable  3.1 cm right renal cyst.  Diffusely scattered colonic diverticulosis without evidence of diverticulitis. 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 I have personally reviewed the films ? Impression/Recommendation Urinary tract infection. Cultures pending Currently on ceftriaxone.  Depending on the culture we can adjust the antibiotic. Would get a post void bladder scan to look at residue and to rule out incomplete emptying. The CT scan did not show any hydronephrosis or renal stone. ? Hypertension.  On admission had soft blood pressure.  He is currently taking clonidine.  Losartan and HCTZ are on hold.  Hyperlipidemia on atorvastatin  ? ___________________________________________________ Discussed with patient, and his wife and the provider. Note:  This document was prepared using Dragon voice recognition software and may include unintentional dictation errors.

## 2021-03-15 DIAGNOSIS — N390 Urinary tract infection, site not specified: Secondary | ICD-10-CM

## 2021-03-15 LAB — CBC WITH DIFFERENTIAL/PLATELET
Abs Immature Granulocytes: 0.07 10*3/uL (ref 0.00–0.07)
Basophils Absolute: 0 10*3/uL (ref 0.0–0.1)
Basophils Relative: 0 %
Eosinophils Absolute: 0.1 10*3/uL (ref 0.0–0.5)
Eosinophils Relative: 0 %
HCT: 41.6 % (ref 39.0–52.0)
Hemoglobin: 14.4 g/dL (ref 13.0–17.0)
Immature Granulocytes: 1 %
Lymphocytes Relative: 9 %
Lymphs Abs: 1.1 10*3/uL (ref 0.7–4.0)
MCH: 31.7 pg (ref 26.0–34.0)
MCHC: 34.6 g/dL (ref 30.0–36.0)
MCV: 91.6 fL (ref 80.0–100.0)
Monocytes Absolute: 1.2 10*3/uL — ABNORMAL HIGH (ref 0.1–1.0)
Monocytes Relative: 11 %
Neutro Abs: 8.9 10*3/uL — ABNORMAL HIGH (ref 1.7–7.7)
Neutrophils Relative %: 79 %
Platelets: 125 10*3/uL — ABNORMAL LOW (ref 150–400)
RBC: 4.54 MIL/uL (ref 4.22–5.81)
RDW: 13.5 % (ref 11.5–15.5)
WBC: 11.4 10*3/uL — ABNORMAL HIGH (ref 4.0–10.5)
nRBC: 0 % (ref 0.0–0.2)

## 2021-03-15 LAB — BASIC METABOLIC PANEL
Anion gap: 7 (ref 5–15)
BUN: 16 mg/dL (ref 8–23)
CO2: 26 mmol/L (ref 22–32)
Calcium: 8.4 mg/dL — ABNORMAL LOW (ref 8.9–10.3)
Chloride: 100 mmol/L (ref 98–111)
Creatinine, Ser: 1.12 mg/dL (ref 0.61–1.24)
GFR, Estimated: >60 mL/min (ref >60)
Glucose, Bld: 183 mg/dL — ABNORMAL HIGH (ref 70–99)
Potassium: 3.5 mmol/L (ref 3.5–5.1)
Sodium: 133 mmol/L — ABNORMAL LOW (ref 135–145)

## 2021-03-15 LAB — PROCALCITONIN: Procalcitonin: 2.87 ng/mL

## 2021-03-15 MED ORDER — PSYLLIUM 95 % PO PACK
1.0000 | PACK | Freq: Every day | ORAL | Status: DC
  Administered 2021-03-15 – 2021-03-16 (×2): 1 via ORAL
  Filled 2021-03-15 (×2): qty 1

## 2021-03-15 NOTE — Care Management Important Message (Signed)
Important Message  Patient Details  Name: Olegario Emberson MRN: [id number] Date of Birth: 13-Jan-1951   Medicare Important Message Given:  N/A - LOS <3 / Initial given by admissions   Initial Medicare IM reviewed with Harlan Stains, spouse by Thornton Dales, Patient Access Associate on 03/14/2021 at 8:45am.  Dannette Barbara 03/15/2021, 8:22 AM

## 2021-03-15 NOTE — Plan of Care (Signed)
Continuing with plan of care. 

## 2021-03-15 NOTE — Progress Notes (Signed)
PROGRESS NOTE    Marc Weaver  WJX:[id number] DOB: April 25, 1951 DOA: 03/13/2021 PCP: Adin Hector, MD   Brief Narrative:  70 y.o. male with medical history significant for hypertension, BPH, GERD who presents to the ER from the urgent care center for evaluation of a 3-day history of nausea, vomiting, urinary incontinence, urgency, frequency, dysuria and chills. His symptoms have progressively worsened over the last 3 days and he went to the urgent care center where they sent him to the ER for further evaluation due to concerns for possible sepsis. Patient complained of feeling dizzy when he arrived and stated that his blood pressure has been running lower than normal. He has had poor oral intake.  Started on aggressive intravenous fluids and IV Rocephin.  Sepsis physiology improving.  Reports good appetite.  Low-grade temperatures noted late 6/16 in early 6/17.  Patient otherwise feels well.  Urine culture with E. coli 50,000 colony-forming units, sensitivities pending.  Is endorsing some diarrhea.  No abdominal pain   Assessment & Plan:   Principal Problem:   Sepsis ([place]) Active Problems:   UTI (urinary tract infection)   Hypertension   AKI (acute kidney injury) ([place])  Complicated UTI in a male Severe sepsis secondary to above Sepsis defended by fever, tachycardia, tachypnea, elevated lactic acid Hypotension did resolve with IV fluids Urinalysis consistent with infection however CT imaging survey negative for cystitis or pyelonephritis Urine culture with E. Coli Low-grade temperature noted 6/17 Plan: Continue IV fluid resuscitation Continue IV Rocephin Monitor blood and urine cultures Anticipate discharge on 6/18  Acute kidney injury Prerenal azotemia versus acute tubular necrosis Improving on IV fluids Plan: Hold losartan and chlorthalidone Continue IV fluid resuscitation Repeat renal parameters in a.m.  Diarrhea Possible antibiotic associated Presentation not  entirely consistent with C. Difficile Continue probiotic Add psyllium for stool bulking effect Patient has uptrending white count or other signs of sepsis we will proceed with C. difficile testing   DVT prophylaxis: SQ Lovenox Code Status: Full Family Communication: Wife at bedside Disposition Plan: Status is: Inpatient  Remains inpatient appropriate because:Inpatient level of care appropriate due to severity of illness  Dispo: The patient is from: Home              Anticipated d/c is to: Home              Patient currently is not medically stable to d/c.   Difficult to place patient No Complicated UTI.  Sepsis physiology resolved.  No other obvious sources of infection.  Appears to be improving on IV Rocephin and fluids.  Possible discharge on 6/18.      Level of care: Med-Surg  Consultants:  Infectious disease  Procedures:  None  Antimicrobials:  Ceftriaxone   Subjective: Patient seen and examined.  Resting comfortably in bed.  No visible distress.  Does endorse diarrhea  Objective: Vitals:   03/15/21 0005 03/15/21 0435 03/15/21 0551 03/15/21 1059  BP:  119/67  126/70  Pulse:  78  70  Resp:  20  20  Temp: 99.1 F (37.3 C) (!) 100.5 F (38.1 C) 99.5 F (37.5 C) 98.9 F (37.2 C)  TempSrc: Oral   Oral  SpO2:  97%  97%  Weight:      Height:        Intake/Output Summary (Last 24 hours) at 03/15/2021 1447 Last data filed at 03/15/2021 1300 Gross per 24 hour  Intake 4169.4 ml  Output 2350 ml  Net 1819.4 ml   Danley Danker  Weights   03/13/21 1510  Weight: 103 kg    Examination:  General exam: Appears calm and comfortable  Respiratory system: Clear to auscultation. Respiratory effort normal. Cardiovascular system: S1 & S2 heard, RRR. No JVD, murmurs, rubs, gallops or clicks. No pedal edema. Gastrointestinal system: Abdomen is nondistended, soft and nontender. No organomegaly or masses felt. Normal bowel sounds heard. Central nervous system: Alert and oriented.  No focal neurological deficits. Extremities: Symmetric 5 x 5 power. Skin: No rashes, lesions or ulcers Psychiatry: Judgement and insight appear normal. Mood & affect appropriate.     Data Reviewed: I have personally reviewed following labs and imaging studies  CBC: Recent Labs  Lab 03/13/21 1327 03/13/21 1510 03/14/21 0438 03/15/21 0632  WBC 6.5 6.0 12.0* 11.4*  NEUTROABS 5.1 4.6  --  8.9*  HGB 13.9 13.6 14.8 14.4  HCT 39.7 38.8* 43.4 41.6  MCV 91.5 93.3 93.9 91.6  PLT 130* 127* 133* 268*   Basic Metabolic Panel: Recent Labs  Lab 03/13/21 1327 03/13/21 1510 03/14/21 0438 03/15/21 0632  NA 129* 132* 135 133*  K 3.3* 3.1* 3.7 3.5  CL 94* 100 99 100  CO2 27 25 27 26   GLUCOSE 125* 117* 126* 183*  BUN 47* 42* 26* 16  CREATININE 1.88* 1.61* 1.35* 1.12  CALCIUM 8.3* 7.9* 8.6* 8.4*  MG  --  2.0  --   --    GFR: Estimated Creatinine Clearance: 75 mL/min (by C-G formula based on SCr of 1.12 mg/dL). Liver Function Tests: Recent Labs  Lab 03/13/21 1327  AST 33  ALT 32  ALKPHOS 79  BILITOT 1.1  PROT 6.4*  ALBUMIN 3.6   No results for input(s): LIPASE, AMYLASE in the last 168 hours. No results for input(s): AMMONIA in the last 168 hours. Coagulation Profile: Recent Labs  Lab 03/14/21 0438  INR 1.3*   Cardiac Enzymes: No results for input(s): CKTOTAL, CKMB, CKMBINDEX, TROPONINI in the last 168 hours. BNP (last 3 results) No results for input(s): PROBNP in the last 8760 hours. HbA1C: No results for input(s): HGBA1C in the last 72 hours. CBG: No results for input(s): GLUCAP in the last 168 hours. Lipid Profile: No results for input(s): CHOL, HDL, LDLCALC, TRIG, CHOLHDL, LDLDIRECT in the last 72 hours. Thyroid Function Tests: No results for input(s): TSH, T4TOTAL, FREET4, T3FREE, THYROIDAB in the last 72 hours. Anemia Panel: No results for input(s): VITAMINB12, FOLATE, FERRITIN, TIBC, IRON, RETICCTPCT in the last 72 hours. Sepsis Labs: Recent Labs  Lab  03/13/21 1510 03/13/21 1911 03/13/21 2220 03/14/21 0438 03/15/21 3419  PROCALCITON  --   --   --  2.21 2.87  LATICACIDVEN 1.0 1.1 2.3* 1.9  --     Recent Results (from the past 240 hour(s))  Urine Culture     Status: Abnormal (Preliminary result)   Collection Time: 03/13/21  1:18 PM   Specimen: Urine, Random  Result Value Ref Range Status   Specimen Description   Final    URINE, RANDOM Performed at [facility], [address] [place], [place] [address]    Special Requests   Final    NONE Performed at [facility], [address], [place], [place] [address]    Culture (A)  Final    50,000 COLONIES/mL ESCHERICHIA COLI SUSCEPTIBILITIES TO FOLLOW Performed at [facility], [place] [address], [place], [place] [address]    Report Status PENDING  Incomplete  Blood culture (routine x 2)     Status:  None (Preliminary result)   Collection Time: 03/13/21  3:50 PM   Specimen: BLOOD  Result Value Ref Range Status   Specimen Description BLOOD BLOOD LEFT HAND  Final   Special Requests   Final    BOTTLES DRAWN AEROBIC ONLY Blood Culture results may not be optimal due to an inadequate volume of blood received in culture bottles   Culture   Final    NO GROWTH < 24 HOURS Performed at [facility], [address] [place], [place] [address]    Report Status PENDING  Incomplete  Blood culture (routine x 2)     Status: None (Preliminary result)   Collection Time: 03/13/21  3:50 PM   Specimen: BLOOD  Result Value Ref Range Status   Specimen Description BLOOD RIGHT ANTECUBITAL  Final   Special Requests   Final    BOTTLES DRAWN AEROBIC AND ANAEROBIC Blood Culture adequate volume   Culture   Final    NO GROWTH < 24 HOURS Performed at [facility], [address] [place], [place] [address]    Report Status PENDING  Incomplete  SARS CORONAVIRUS 2 (TAT 6-24 HRS) Nasopharyngeal Nasopharyngeal Swab     Status: None   Collection Time: 03/13/21   6:06 PM   Specimen: Nasopharyngeal Swab  Result Value Ref Range Status   SARS Coronavirus 2 NEGATIVE NEGATIVE Final    Comment: (NOTE) SARS-CoV-2 target nucleic acids are NOT DETECTED.  The SARS-CoV-2 RNA is generally detectable in upper and lower respiratory specimens during the acute phase of infection. Negative results do not preclude SARS-CoV-2 infection, do not rule out co-infections with other pathogens, and should not be used as the sole basis for treatment or other patient management decisions. Negative results must be combined with clinical observations, patient history, and epidemiological information. The expected result is Negative.  Fact Sheet for Patients: SugarRoll.be  Fact Sheet for Healthcare Providers: https://www.woods-mathews.com/  This test is not yet approved or cleared by the [place] FDA and  has been authorized for detection and/or diagnosis of SARS-CoV-2 by FDA under an Emergency Use Authorization (EUA). This EUA will remain  in effect (meaning this test can be used) for the duration of the COVID-19 declaration under Se ction 564(b)(1) of the Act, 21 U.S.C. section 360bbb-3(b)(1), unless the authorization is terminated or revoked sooner.  Performed at [facility], [place] [address] [place], [place] [address]          Radiology Studies: CT ABDOMEN PELVIS WO CONTRAST  Result Date: 03/13/2021 CLINICAL DATA:  UTI. EXAM: CT ABDOMEN AND PELVIS WITHOUT CONTRAST TECHNIQUE: Multidetector CT imaging of the abdomen and pelvis was performed following the standard protocol without IV contrast. COMPARISON:  None. FINDINGS: Lower chest: No acute abnormality. Hepatobiliary: No focal liver abnormality is seen. No gallstones, gallbladder wall thickening, or biliary dilatation. Pancreas: Unremarkable. No pancreatic ductal dilatation or surrounding inflammatory changes. Spleen: Normal in size without focal abnormality.  Adrenals/Urinary Tract: No evidence of hydronephrosis or nephrolithiasis. 3.1 cm right renal cyst. Normal urinary bladder. Normal adrenal glands. Stomach/Bowel: Stomach is within normal limits. No evidence of appendicitis. No evidence of bowel wall thickening, distention, or inflammatory changes. Diffusely scattered colonic diverticulosis, particularly of the sigmoid colon without evidence of diverticulitis. Vascular/Lymphatic: Tortuosity and calcific atherosclerotic disease of the aorta. 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 Reproductive: Prostate is unremarkable. Other: No abdominal wall hernia or abnormality. No abdominopelvic ascites. Musculoskeletal: Spondylosis of the spine.  IMPRESSION: 1. No evidence of nephrolithiasis or obstructive uropathy. 2. 3.1 cm right renal cyst. 3. Diffusely scattered colonic diverticulosis without evidence of diverticulitis. 4. 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 Recommend follow-up ultrasound every 2 years. This recommendation follows ACR consensus guidelines: White Paper of the ACR Incidental Findings Committee II on Vascular Findings. J Am Coll Radiol 2013; 10:789-794. Aortic Atherosclerosis (ICD10-I70.0). Electronically Signed   By: Fidela Salisbury M.D.   On: 03/13/2021 16:19        Scheduled Meds:  acidophilus  1 capsule Oral Daily   aspirin EC  81 mg Oral QHS   atorvastatin  40 mg Oral Daily   cloNIDine  0.1 mg Oral QHS   enoxaparin (LOVENOX) injection  50 mg Subcutaneous Q24H   pantoprazole  40 mg Oral Daily   psyllium  1 packet Oral Daily   traZODone  50 mg Oral QHS   Continuous Infusions:  cefTRIAXone (ROCEPHIN)  IV     lactated ringers with kcl 75 mL/hr at 03/15/21 1301     LOS: 2 days    Time spent: 25 minutes    Sidney Ace, MD [facility] Pager [phone]  If 7PM-7AM,  please contact night-coverage 03/15/2021, 2:47 PM

## 2021-03-15 NOTE — Progress Notes (Signed)
Date of Admission:  03/13/2021    ID: Marc Weaver is a 70 y.o. male  Principal Problem:   Sepsis ([place]) Active Problems:   UTI (urinary tract infection)   Hypertension   AKI (acute kidney injury) ([place])    Subjective: Doing better No fever or chills No dysuria No frequency or urgency  Medications:   acidophilus  1 capsule Oral Daily   aspirin EC  81 mg Oral QHS   atorvastatin  40 mg Oral Daily   cloNIDine  0.1 mg Oral QHS   enoxaparin (LOVENOX) injection  50 mg Subcutaneous Q24H   pantoprazole  40 mg Oral Daily   psyllium  1 packet Oral Daily   traZODone  50 mg Oral QHS    Objective: Vital signs in last 24 hours: .vs Temp:  [98.9 F (37.2 C)-100.5 F (38.1 C)] 98.9 F (37.2 C) (06/17 1059) Pulse Rate:  [70-83] 70 (06/17 1059) Resp:  [16-20] 20 (06/17 1059) BP: (100-126)/(57-74) 126/70 (06/17 1059) SpO2:  [97 %-100 %] 97 % (06/17 1059)  PHYSICAL EXAM:  General: Alert, cooperative, no distress, appears stated age.  Head: Normocephalic, without obvious abnormality, atraumatic. Eyes: Conjunctivae clear, anicteric sclerae. Pupils are equal ENT Nares normal. No drainage or sinus tenderness. Lips, mucosa, and tongue normal. No Thrush Neck: Supple, symmetrical, no adenopathy, thyroid: non tender no carotid bruit and no JVD. Back: No CVA tenderness. Lungs: Clear to auscultation bilaterally. No Wheezing or Rhonchi. No rales. Heart: Regular rate and rhythm, no murmur, rub or gallop. Abdomen: Soft, non-tender,not distended. Bowel sounds normal. No masses Extremities: atraumatic, no cyanosis. No edema. No clubbing Skin: No rashes or lesions. Or bruising Lymph: Cervical, supraclavicular normal. Neurologic: Grossly non-focal  Lab Results Recent Labs    03/14/21 0438 03/15/21 0632  WBC 12.0* 11.4*  HGB 14.8 14.4  HCT 43.4 41.6  NA 135 133*  K 3.7 3.5  CL 99 100  CO2 27 26  BUN 26* 16  CREATININE 1.35* 1.12   Liver Panel Recent Labs    03/13/21 1327  PROT  6.4*  ALBUMIN 3.6  AST 33  ALT 32  ALKPHOS 79  BILITOT 1.1   Sedimentation Rate No results for input(s): ESRSEDRATE in the last 72 hours. C-Reactive Protein No results for input(s): CRP in the last 72 hours.  Microbiology:  Studies/Results: CT ABDOMEN PELVIS WO CONTRAST  Result Date: 03/13/2021 CLINICAL DATA:  UTI. EXAM: CT ABDOMEN AND PELVIS WITHOUT CONTRAST TECHNIQUE: Multidetector CT imaging of the abdomen and pelvis was performed following the standard protocol without IV contrast. COMPARISON:  None. FINDINGS: Lower chest: No acute abnormality. Hepatobiliary: No focal liver abnormality is seen. No gallstones, gallbladder wall thickening, or biliary dilatation. Pancreas: Unremarkable. No pancreatic ductal dilatation or surrounding inflammatory changes. Spleen: Normal in size without focal abnormality. Adrenals/Urinary Tract: No evidence of hydronephrosis or nephrolithiasis. 3.1 cm right renal cyst. Normal urinary bladder. Normal adrenal glands. Stomach/Bowel: Stomach is within normal limits. No evidence of appendicitis. No evidence of bowel wall thickening, distention, or inflammatory changes. Diffusely scattered colonic diverticulosis, particularly of the sigmoid colon without evidence of diverticulitis. Vascular/Lymphatic: Tortuosity and calcific atherosclerotic disease of the aorta. 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 Reproductive: Prostate is unremarkable. Other: No abdominal wall hernia or abnormality. No abdominopelvic ascites. Musculoskeletal: Spondylosis of the spine. IMPRESSION: 1. No evidence of nephrolithiasis or obstructive uropathy. 2. 3.1 cm right renal cyst. 3. Diffusely scattered colonic diverticulosis without evidence of diverticulitis. 4. Fusiform  aneurysmal dilation of the infrarenal abdominal aorta measuring 3.5 cm in maximum transverse diameter. Recommend follow-up ultrasound every 2 years. This  recommendation follows ACR consensus guidelines: White Paper of the ACR Incidental Findings Committee II on Vascular Findings. J Am Coll Radiol 2013; 10:789-794. Aortic Atherosclerosis (ICD10-I70.0). Electronically Signed   By: Fidela Salisbury M.D.   On: 03/13/2021 16:19     Assessment/Plan:  Urinary tract infection with E. coli.   Post void  was zero residue The CT scan did not show any hydronephrosis or renal stone Currently on ceftriaxone.depending on susceptibility can decide on PO antibiotic like levquin/bactrim on discharge until 03/21/21  Possible gastroenteritis  Hypotension on admission which is resolved  AKI is improved  Hyperlipidemia on atorvastatin   ID will follow him peripherally this weekend- call if needed

## 2021-03-16 LAB — BASIC METABOLIC PANEL
Anion gap: 5 (ref 5–15)
BUN: 16 mg/dL (ref 8–23)
CO2: 28 mmol/L (ref 22–32)
Calcium: 8.2 mg/dL — ABNORMAL LOW (ref 8.9–10.3)
Chloride: 101 mmol/L (ref 98–111)
Creatinine, Ser: 0.96 mg/dL (ref 0.61–1.24)
GFR, Estimated: >60 mL/min (ref >60)
Glucose, Bld: 138 mg/dL — ABNORMAL HIGH (ref 70–99)
Potassium: 3.4 mmol/L — ABNORMAL LOW (ref 3.5–5.1)
Sodium: 134 mmol/L — ABNORMAL LOW (ref 135–145)

## 2021-03-16 LAB — CBC WITH DIFFERENTIAL/PLATELET
Abs Immature Granulocytes: 0.09 10*3/uL — ABNORMAL HIGH (ref 0.00–0.07)
Basophils Absolute: 0 10*3/uL (ref 0.0–0.1)
Basophils Relative: 0 %
Eosinophils Absolute: 0.2 10*3/uL (ref 0.0–0.5)
Eosinophils Relative: 2 %
HCT: 39.6 % (ref 39.0–52.0)
Hemoglobin: 13.7 g/dL (ref 13.0–17.0)
Immature Granulocytes: 1 %
Lymphocytes Relative: 14 %
Lymphs Abs: 1.1 10*3/uL (ref 0.7–4.0)
MCH: 31.8 pg (ref 26.0–34.0)
MCHC: 34.6 g/dL (ref 30.0–36.0)
MCV: 91.9 fL (ref 80.0–100.0)
Monocytes Absolute: 0.8 10*3/uL (ref 0.1–1.0)
Monocytes Relative: 10 %
Neutro Abs: 5.4 10*3/uL (ref 1.7–7.7)
Neutrophils Relative %: 73 %
Platelets: 141 10*3/uL — ABNORMAL LOW (ref 150–400)
RBC: 4.31 MIL/uL (ref 4.22–5.81)
RDW: 13.2 % (ref 11.5–15.5)
WBC: 7.5 10*3/uL (ref 4.0–10.5)
nRBC: 0 % (ref 0.0–0.2)

## 2021-03-16 LAB — URINE CULTURE: Culture: 50000 — AB

## 2021-03-16 LAB — PROCALCITONIN: Procalcitonin: 1.61 ng/mL

## 2021-03-16 MED ORDER — LEVOFLOXACIN 750 MG PO TABS: 750.0000 mg | ORAL_TABLET | Freq: Every day | ORAL | 0 refills | Status: AC

## 2021-03-16 MED ORDER — SODIUM CHLORIDE 0.9 % IV SOLN
2.0000 g | INTRAVENOUS | Status: DC
  Administered 2021-03-16: 2 g via INTRAVENOUS
  Filled 2021-03-16 (×2): qty 20

## 2021-03-16 NOTE — Plan of Care (Signed)
Discharge teaching completed with patient who is in stable condition. 

## 2021-03-16 NOTE — Discharge Summary (Signed)
Physician Discharge Summary  Marc Weaver HYW:[id number] DOB: 1950/11/16 DOA: 03/13/2021  PCP: Adin Hector, MD  Admit date: 03/13/2021 Discharge date: 03/16/2021  Admitted From: Home Disposition: Home  Recommendations for Outpatient Follow-up:  Follow up with PCP in 1-2 weeks  Home Health: No Equipment/Devices: None  Discharge Condition: Stable CODE STATUS: Full Diet recommendation: Heart Healthy  Brief/Interim Summary: 70 y.o. male with medical history significant for hypertension, BPH, GERD who presents to the ER from the urgent care center for evaluation of a 3-day history of nausea, vomiting, urinary incontinence, urgency, frequency, dysuria and chills. His symptoms have progressively worsened over the last 3 days and he went to the urgent care center where they sent him to the ER for further evaluation due to concerns for possible sepsis. Patient complained of feeling dizzy when he arrived and stated that his blood pressure has been running lower than normal. He has had poor oral intake.   Started on aggressive intravenous fluids and IV Rocephin.  Sepsis physiology improving.  Reports good appetite.  On day of discharge patient is maintain fever free for over 24 hours.  Urine culture significant for pansensitive E. coli.  Transition to p.o. Levaquin.  Complete additional 4 days of antibiotics.  End date 6/23.  Okay for discharge at this time  Discharge Diagnoses:  Principal Problem:   Sepsis ([place]) Active Problems:   UTI (urinary tract infection)   Hypertension   AKI (acute kidney injury) ([place])  Complicated UTI in a male Severe sepsis secondary to above Sepsis defended by fever, tachycardia, tachypnea, elevated lactic acid Hypotension did resolve with IV fluids Urinalysis consistent with infection however CT imaging survey negative for cystitis or pyelonephritis Maintained on IV Rocephin Blood cultures no growth to date Urine culture pansensitive E. Coli Transition  to p.o. Levaquin, complete additional 4 days Antibiotic stop date 6/23   Acute kidney injury Prerenal azotemia versus acute tubular necrosis Improving on IV fluids Can resume home regimen on discharge  Discharge Instructions  Discharge Instructions     Diet - low sodium heart healthy   Complete by: As directed    Increase activity slowly   Complete by: As directed       Allergies as of 03/16/2021       Reactions   Gabapentin Other (See Comments)   Makes patient "feel bad"   Other Hives   Nuts   Yeast-related Products Hives        Medication List     TAKE these medications    acidophilus Caps capsule Take 1 capsule by mouth daily.   aspirin 81 MG tablet Take 81 mg by mouth at bedtime.   atorvastatin 40 MG tablet Commonly known as: LIPITOR Take 40 mg by mouth daily.   chlorthalidone 25 MG tablet Commonly known as: HYGROTON Take 25 mg by mouth daily.   etodolac 500 MG tablet Commonly known as: LODINE Take 500 mg by mouth daily.   levofloxacin 750 MG tablet Commonly known as: Levaquin Take 1 tablet (750 mg total) by mouth daily for 4 days. Start taking on: March 17, 2021   losartan 50 MG tablet Commonly known as: COZAAR Take 50 mg by mouth daily.   pantoprazole 40 MG tablet Commonly known as: PROTONIX Take 40 mg by mouth daily.   traZODone 50 MG tablet Commonly known as: DESYREL Take 50 mg by mouth at bedtime.        Allergies  Allergen Reactions   Gabapentin Other (See Comments)  Makes patient "feel bad"   Other Hives    Nuts   Yeast-Related Products Hives    Consultations: ID   Procedures/Studies: CT ABDOMEN PELVIS WO CONTRAST  Result Date: 03/13/2021 CLINICAL DATA:  UTI. EXAM: CT ABDOMEN AND PELVIS WITHOUT CONTRAST TECHNIQUE: Multidetector CT imaging of the abdomen and pelvis was performed following the standard protocol without IV contrast. COMPARISON:  None. FINDINGS: Lower chest: No acute abnormality. Hepatobiliary: No focal  liver abnormality is seen. No gallstones, gallbladder wall thickening, or biliary dilatation. Pancreas: Unremarkable. No pancreatic ductal dilatation or surrounding inflammatory changes. Spleen: Normal in size without focal abnormality. Adrenals/Urinary Tract: No evidence of hydronephrosis or nephrolithiasis. 3.1 cm right renal cyst. Normal urinary bladder. Normal adrenal glands. Stomach/Bowel: Stomach is within normal limits. No evidence of appendicitis. No evidence of bowel wall thickening, distention, or inflammatory changes. Diffusely scattered colonic diverticulosis, particularly of the sigmoid colon without evidence of diverticulitis. Vascular/Lymphatic: Tortuosity and calcific atherosclerotic disease of the aorta. 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 Reproductive: Prostate is unremarkable. Other: No abdominal wall hernia or abnormality. No abdominopelvic ascites. Musculoskeletal: Spondylosis of the spine. IMPRESSION: 1. No evidence of nephrolithiasis or obstructive uropathy. 2. 3.1 cm right renal cyst. 3. Diffusely scattered colonic diverticulosis without evidence of diverticulitis. 4. 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 Recommend follow-up ultrasound every 2 years. This recommendation follows ACR consensus guidelines: White Paper of the ACR Incidental Findings Committee II on Vascular Findings. J Am Coll Radiol 2013; 10:789-794. Aortic Atherosclerosis (ICD10-I70.0). Electronically Signed   By: Fidela Salisbury M.D.   On: 03/13/2021 16:19   (Echo, Carotid, EGD, Colonoscopy, ERCP)    Subjective: Patient seen and examined on the day of discharge.  Stable, no distress.  Tolerating p.o.  No nausea vomiting.  Abdominal pain improved.  No dysuria.  Stable for discharge home  Discharge Exam: Vitals:   03/16/21 0558 03/16/21 1148  BP: 130/72 121/74   Pulse: 71 66  Resp: 20 17  Temp: 98.6 F (37 C) 98.6 F (37 C)  SpO2: 100% 97%   Vitals:   03/15/21 2041 03/15/21 2242 03/16/21 0558 03/16/21 1148  BP: (!) 141/76 129/73 130/72 121/74  Pulse: 79 76 71 66  Resp: 18 16 20 17   Temp: 100 F (37.8 C) 99.5 F (37.5 C) 98.6 F (37 C) 98.6 F (37 C)  TempSrc: Oral  Oral Oral  SpO2: 97% 96% 100% 97%  Weight:      Height:        General: Pt is alert, awake, not in acute distress Cardiovascular: RRR, S1/S2 +, no rubs, no gallops Respiratory: CTA bilaterally, no wheezing, no rhonchi Abdominal: Soft, NT, ND, bowel sounds + Extremities: no edema, no cyanosis    The results of significant diagnostics from this hospitalization (including imaging, microbiology, ancillary and laboratory) are listed below for reference.     Microbiology: Recent Results (from the past 240 hour(s))  Urine Culture     Status: Abnormal   Collection Time: 03/13/21  1:18 PM   Specimen: Urine, Random  Result Value Ref Range Status   Specimen Description   Final    URINE, RANDOM Performed at [facility], [address], [place], [place] [address]    Special Requests   Final    NONE Performed at [facility], [address], [place], [place] [address]    Culture 50,000 COLONIES/mL ESCHERICHIA COLI (  A)  Final   Report Status 03/16/2021 FINAL  Final   Organism ID, Bacteria ESCHERICHIA COLI (A)  Final      Susceptibility   Escherichia coli - MIC*    AMPICILLIN <=2 SENSITIVE Sensitive     CEFAZOLIN <=4 SENSITIVE Sensitive     CEFEPIME <=0.12 SENSITIVE Sensitive     CEFTRIAXONE <=0.25 SENSITIVE Sensitive     CIPROFLOXACIN <=0.25 SENSITIVE Sensitive     GENTAMICIN <=1 SENSITIVE Sensitive     IMIPENEM <=0.25 SENSITIVE Sensitive     NITROFURANTOIN <=16 SENSITIVE Sensitive     TRIMETH/SULFA <=20 SENSITIVE Sensitive     AMPICILLIN/SULBACTAM <=2 SENSITIVE Sensitive     PIP/TAZO <=4 SENSITIVE Sensitive     * 50,000 COLONIES/mL  ESCHERICHIA COLI  Blood culture (routine x 2)     Status: None (Preliminary result)   Collection Time: 03/13/21  3:50 PM   Specimen: BLOOD  Result Value Ref Range Status   Specimen Description BLOOD BLOOD LEFT HAND  Final   Special Requests   Final    BOTTLES DRAWN AEROBIC ONLY Blood Culture results may not be optimal due to an inadequate volume of blood received in culture bottles   Culture   Final    NO GROWTH 3 DAYS Performed at [facility], [address] [place], [place] [address]    Report Status PENDING  Incomplete  Blood culture (routine x 2)     Status: None (Preliminary result)   Collection Time: 03/13/21  3:50 PM   Specimen: BLOOD  Result Value Ref Range Status   Specimen Description BLOOD RIGHT ANTECUBITAL  Final   Special Requests   Final    BOTTLES DRAWN AEROBIC AND ANAEROBIC Blood Culture adequate volume   Culture   Final    NO GROWTH 3 DAYS Performed at [facility], [address], [place], [place] [address]    Report Status PENDING  Incomplete  SARS CORONAVIRUS 2 (TAT 6-24 HRS) Nasopharyngeal Nasopharyngeal Swab     Status: None   Collection Time: 03/13/21  6:06 PM   Specimen: Nasopharyngeal Swab  Result Value Ref Range Status   SARS Coronavirus 2 NEGATIVE NEGATIVE Final    Comment: (NOTE) SARS-CoV-2 target nucleic acids are NOT DETECTED.  The SARS-CoV-2 RNA is generally detectable in upper and lower respiratory specimens during the acute phase of infection. Negative results do not preclude SARS-CoV-2 infection, do not rule out co-infections with other pathogens, and should not be used as the sole basis for treatment or other patient management decisions. Negative results must be combined with clinical observations, patient history, and epidemiological information. The expected result is Negative.  Fact Sheet for Patients: SugarRoll.be  Fact Sheet for Healthcare  Providers: https://www.woods-mathews.com/  This test is not yet approved or cleared by the [place] FDA and  has been authorized for detection and/or diagnosis of SARS-CoV-2 by FDA under an Emergency Use Authorization (EUA). This EUA will remain  in effect (meaning this test can be used) for the duration of the COVID-19 declaration under Se ction 564(b)(1) of the Act, 21 U.S.C. section 360bbb-3(b)(1), unless the authorization is terminated or revoked sooner.  Performed at [facility], [place] [address], [place], [place] [address]      Labs: BNP (last 3 results) No results for input(s): BNP in the last 8760 hours. Basic Metabolic Panel: Recent Labs  Lab 03/13/21 1327 03/13/21 1510 03/14/21 0438 03/15/21 0632 03/16/21 0444  NA 129* 132* 135 133* 134*  K 3.3*  3.1* 3.7 3.5 3.4*  CL 94* 100 99 100 101  CO2 27 25 27 26 28   GLUCOSE 125* 117* 126* 183* 138*  BUN 47* 42* 26* 16 16  CREATININE 1.88* 1.61* 1.35* 1.12 0.96  CALCIUM 8.3* 7.9* 8.6* 8.4* 8.2*  MG  --  2.0  --   --   --    Liver Function Tests: Recent Labs  Lab 03/13/21 1327  AST 33  ALT 32  ALKPHOS 79  BILITOT 1.1  PROT 6.4*  ALBUMIN 3.6   No results for input(s): LIPASE, AMYLASE in the last 168 hours. No results for input(s): AMMONIA in the last 168 hours. CBC: Recent Labs  Lab 03/13/21 1327 03/13/21 1510 03/14/21 0438 03/15/21 0632 03/16/21 0444  WBC 6.5 6.0 12.0* 11.4* 7.5  NEUTROABS 5.1 4.6  --  8.9* 5.4  HGB 13.9 13.6 14.8 14.4 13.7  HCT 39.7 38.8* 43.4 41.6 39.6  MCV 91.5 93.3 93.9 91.6 91.9  PLT 130* 127* 133* 125* 141*   Cardiac Enzymes: No results for input(s): CKTOTAL, CKMB, CKMBINDEX, TROPONINI in the last 168 hours. BNP: Invalid input(s): POCBNP CBG: No results for input(s): GLUCAP in the last 168 hours. D-Dimer No results for input(s): DDIMER in the last 72 hours. Hgb A1c No results for input(s): HGBA1C in the last 72 hours. Lipid Profile No results for  input(s): CHOL, HDL, LDLCALC, TRIG, CHOLHDL, LDLDIRECT in the last 72 hours. Thyroid function studies No results for input(s): TSH, T4TOTAL, T3FREE, THYROIDAB in the last 72 hours.  Invalid input(s): FREET3 Anemia work up No results for input(s): VITAMINB12, FOLATE, FERRITIN, TIBC, IRON, RETICCTPCT in the last 72 hours. Urinalysis    Component Value Date/Time   COLORURINE YELLOW 03/13/2021 1318   APPEARANCEUR HAZY (A) 03/13/2021 1318   LABSPEC 1.010 03/13/2021 1318   PHURINE 5.5 03/13/2021 1318   GLUCOSEU NEGATIVE 03/13/2021 1318   HGBUR MODERATE (A) 03/13/2021 1318   BILIRUBINUR LARGE (A) 03/13/2021 1318   KETONESUR NEGATIVE 03/13/2021 1318   PROTEINUR NEGATIVE 03/13/2021 1318   NITRITE NEGATIVE 03/13/2021 1318   LEUKOCYTESUR LARGE (A) 03/13/2021 1318   Sepsis Labs Invalid input(s): PROCALCITONIN,  WBC,  LACTICIDVEN Microbiology Recent Results (from the past 240 hour(s))  Urine Culture     Status: Abnormal   Collection Time: 03/13/21  1:18 PM   Specimen: Urine, Random  Result Value Ref Range Status   Specimen Description   Final    URINE, RANDOM Performed at [facility], [address], [place], [place] [address]    Special Requests   Final    NONE Performed at [facility], [address] [place], [place] [address]    Culture 50,000 COLONIES/mL ESCHERICHIA COLI (A)  Final   Report Status 03/16/2021 FINAL  Final   Organism ID, Bacteria ESCHERICHIA COLI (A)  Final      Susceptibility   Escherichia coli - MIC*    AMPICILLIN <=2 SENSITIVE Sensitive     CEFAZOLIN <=4 SENSITIVE Sensitive     CEFEPIME <=0.12 SENSITIVE Sensitive     CEFTRIAXONE <=0.25 SENSITIVE Sensitive     CIPROFLOXACIN <=0.25 SENSITIVE Sensitive     GENTAMICIN <=1 SENSITIVE Sensitive     IMIPENEM <=0.25 SENSITIVE Sensitive     NITROFURANTOIN <=16 SENSITIVE Sensitive     TRIMETH/SULFA <=20 SENSITIVE Sensitive     AMPICILLIN/SULBACTAM <=2 SENSITIVE Sensitive     PIP/TAZO <=4  SENSITIVE Sensitive     * 50,000 COLONIES/mL ESCHERICHIA COLI  Blood culture (routine x  2)     Status: None (Preliminary result)   Collection Time: 03/13/21  3:50 PM   Specimen: BLOOD  Result Value Ref Range Status   Specimen Description BLOOD BLOOD LEFT HAND  Final   Special Requests   Final    BOTTLES DRAWN AEROBIC ONLY Blood Culture results may not be optimal due to an inadequate volume of blood received in culture bottles   Culture   Final    NO GROWTH 3 DAYS Performed at [facility], [address] [place], [place] [address]    Report Status PENDING  Incomplete  Blood culture (routine x 2)     Status: None (Preliminary result)   Collection Time: 03/13/21  3:50 PM   Specimen: BLOOD  Result Value Ref Range Status   Specimen Description BLOOD RIGHT ANTECUBITAL  Final   Special Requests   Final    BOTTLES DRAWN AEROBIC AND ANAEROBIC Blood Culture adequate volume   Culture   Final    NO GROWTH 3 DAYS Performed at [facility], [address] [place], [place] [address]    Report Status PENDING  Incomplete  SARS CORONAVIRUS 2 (TAT 6-24 HRS) Nasopharyngeal Nasopharyngeal Swab     Status: None   Collection Time: 03/13/21  6:06 PM   Specimen: Nasopharyngeal Swab  Result Value Ref Range Status   SARS Coronavirus 2 NEGATIVE NEGATIVE Final    Comment: (NOTE) SARS-CoV-2 target nucleic acids are NOT DETECTED.  The SARS-CoV-2 RNA is generally detectable in upper and lower respiratory specimens during the acute phase of infection. Negative results do not preclude SARS-CoV-2 infection, do not rule out co-infections with other pathogens, and should not be used as the sole basis for treatment or other patient management decisions. Negative results must be combined with clinical observations, patient history, and epidemiological information. The expected result is Negative.  Fact Sheet for Patients: SugarRoll.be  Fact Sheet for  Healthcare Providers: https://www.woods-mathews.com/  This test is not yet approved or cleared by the [place] FDA and  has been authorized for detection and/or diagnosis of SARS-CoV-2 by FDA under an Emergency Use Authorization (EUA). This EUA will remain  in effect (meaning this test can be used) for the duration of the COVID-19 declaration under Se ction 564(b)(1) of the Act, 21 U.S.C. section 360bbb-3(b)(1), unless the authorization is terminated or revoked sooner.  Performed at [facility], [place] [address], [place], [place] [address]      Time coordinating discharge: Over 30 minutes  SIGNED:   Sidney Ace, MD  [facility] 03/16/2021, 12:30 PM Pager   If 7PM-7AM, please contact night-coverage

## 2021-03-18 LAB — CULTURE, BLOOD (ROUTINE X 2)
Culture: NO GROWTH
Culture: NO GROWTH
Special Requests: ADEQUATE

## 2021-11-29 DIAGNOSIS — M7581 Other shoulder lesions, right shoulder: Secondary | ICD-10-CM | POA: Insufficient documentation

## 2021-11-29 DIAGNOSIS — S46101A Unspecified injury of muscle, fascia and tendon of long head of biceps, right arm, initial encounter: Secondary | ICD-10-CM | POA: Insufficient documentation

## 2022-06-11 ENCOUNTER — Ambulatory Visit
Admission: RE | Admit: 2022-06-11 | Discharge: 2022-06-11 | Disposition: A | Discharge status: Home / Self Care | Payer: Medicare HMO | Source: Ambulatory Visit | Attending: Physician Assistant | Admitting: Physician Assistant

## 2022-06-11 VITALS — BP 132/84 | HR 71 | Temp 98.1°F | Ht 71.0 in | Wt 230.0 lb

## 2022-06-11 DIAGNOSIS — G8929 Other chronic pain: Secondary | ICD-10-CM

## 2022-06-11 DIAGNOSIS — M25511 Pain in right shoulder: Secondary | ICD-10-CM | POA: Diagnosis not present

## 2022-06-11 DIAGNOSIS — S46911A Strain of unspecified muscle, fascia and tendon at shoulder and upper arm level, right arm, initial encounter: Secondary | ICD-10-CM | POA: Diagnosis not present

## 2022-06-11 MED ORDER — METHYLPREDNISOLONE 4 MG PO TBPK: ORAL_TABLET | ORAL | 0 refills | Status: DC

## 2022-06-11 MED ORDER — TIZANIDINE HCL 4 MG PO TABS: 4.0000 mg | ORAL_TABLET | Freq: Four times a day (QID) | ORAL | 0 refills | Status: DC | PRN

## 2022-06-11 NOTE — ED Triage Notes (Signed)
Patient reports that he has been having right shoulder pain that has moved to below his mid back on the right -- started a few months ago.  Patient reports that he did put up ceiling fan this weekend and had his hands above his head for a few hours.

## 2022-06-11 NOTE — Discharge Instructions (Addendum)
-  Zanaflex: 1 tablet every 6 hours as needed for muscle spasms.  Would also take nightly to help with sleep -Medrol Dosepak: Take as prescribed on packaging -Continue the etodolac twice daily -Continue with shoulder exercises as provided by orthopedics -Keep scheduled orthopedic appointment

## 2022-06-11 NOTE — ED Provider Notes (Signed)
[facility]    CSN: [id number] Arrival date & time: 06/11/22  1103      History   Chief Complaint Chief Complaint  Patient presents with   Shoulder Pain    Right     Back Pain    Mid right     HPI Marc Weaver is a 71 y.o. male.   Patient is a 71 year old male who presents with chief complaint of right shoulder and right upper back pain that has been ongoing for several months but worse in the upper back more recently.  Patient seen by orthopedics in July given a cortisone injection as well as exercise instructions.  Patient declined physical therapy at that time.  Patient reports the last 10 to 14 days he seems to have more pain underneath the shoulder blade.  He reports last night when he rolled over his back, walked up and took a while for him to to be able to sit up.  He did report icing his arm and shoulder before going to bed last night.  Patient also reports but not the significant over the weekend and having to take multiple breaks due to his shoulder discomfort.    Past Medical History:  Diagnosis Date   AAA (abdominal aortic aneurysm) ([place])    followed by dr Caryl Comes   Atrial myxoma    removed 2008   Barrett's esophagus    BPH (benign prostatic hyperplasia)    Degenerative disc disease, lumbar    arthritis "all-over" back and knees   GERD (gastroesophageal reflux disease)    no meds   Hypercholesteremia    Hyperglycemia    Hypertension    controlled on meds   Numbness and tingling in left hand    Peyronie disease    Sleep apnea    mild by sleep study, no cpap    Patient Active Problem List   Diagnosis Date Noted   UTI (urinary tract infection) 03/13/2021   Sepsis ([place]) 03/13/2021   Hypertension    AKI (acute kidney injury) [facility])     Past Surgical History:  Procedure Laterality Date   CARPAL TUNNEL RELEASE Left 02/09/2015   Procedure: CARPAL TUNNEL RELEASE;  Surgeon: Leanor Kail, MD;  Location: [place];  Service:  Orthopedics;  Laterality: Left;   COLONOSCOPY  06/24/2004 AND 07/22/2013   COLONOSCOPY N/A 10/03/2015   Procedure: COLONOSCOPY;  Surgeon: Hulen Luster, MD;  Location: [place];  Service: Gastroenterology;  Laterality: N/A;   ESOPHAGOGASTRODUODENOSCOPY  07/22/2013   HEARTBURN,HEME-POSITIVE STOOL,POSITIVE BARRETT'S   ESOPHAGOGASTRODUODENOSCOPY N/A 10/03/2015   Procedure: ESOPHAGOGASTRODUODENOSCOPY (EGD);  Surgeon: Hulen Luster, MD;  Location: [place];  Service: Gastroenterology;  Laterality: N/A;   EXCISION OF ATRIAL MYXOMA     KNEE ARTHROSCOPY Right 11/26/2012   partial medial menisectomy and medial chondroplasty   LUMBAR LAMINECTOMY  1988   L4,L5,L6   MAKOPLASTY Right 07/28/2013   MEDIAL COMPARTMENT,partial knee replacement   SHOULDER ARTHROSCOPY WITH OPEN ROTATOR CUFF REPAIR Left 09/11/2017   Procedure: SHOULDER ARTHROSCOPY WITH OPEN ROTATOR CUFF REPAIR,BICEPS TENODEDSIS, AND DECOMPRESSION;  Surgeon: Corky Mull, MD;  Location: [facility];  Service: Orthopedics;  Laterality: Left;   TONSILLECTOMY     WITH ADENOIDECTOMY       Home Medications    Prior to Admission medications   Medication Sig Start Date End Date Taking? Authorizing Provider  methylPREDNISolone (MEDROL DOSEPAK) 4 MG TBPK tablet Follow directions on packaging 06/11/22  Yes Luvenia Redden, PA-C  tiZANidine (  ZANAFLEX) 4 MG tablet Take 1 tablet (4 mg total) by mouth every 6 (six) hours as needed for muscle spasms. 06/11/22  Yes Luvenia Redden, PA-C  acidophilus (RISAQUAD) CAPS capsule Take 1 capsule by mouth daily.    [provider]  aspirin 81 MG tablet Take 81 mg by mouth at bedtime.    [provider]  atorvastatin (LIPITOR) 40 MG tablet Take 40 mg by mouth daily.    [provider]  chlorthalidone (HYGROTON) 25 MG tablet Take 25 mg by mouth daily.    [provider]  etodolac (LODINE) 500 MG tablet Take 500 mg by mouth daily.    [provider]  losartan  (COZAAR) 50 MG tablet Take 50 mg by mouth daily.    [provider]  pantoprazole (PROTONIX) 40 MG tablet Take 40 mg by mouth daily.    [provider]  traZODone (DESYREL) 50 MG tablet Take 50 mg by mouth at bedtime. 07/24/17   [provider]  cloNIDine (CATAPRES) 0.1 MG tablet Take 0.1 mg by mouth at bedtime.  03/13/21  [provider]    Family History Family History  Problem Relation Age of Onset   Stomach cancer Father    Aortic aneurysm Brother     Social History Social History   Tobacco Use   Smoking status: Never   Smokeless tobacco: Never  Vaping Use   Vaping Use: Never used  Substance Use Topics   Alcohol use: Yes    Comment: occ   Drug use: No     Allergies   Gabapentin, Other, and Yeast-related products   Review of Systems Review of Systems as above in HPI.  Other systems reviewed and found to be negative   Physical Exam Triage Vital Signs ED Triage Vitals  Enc Vitals Group     BP 06/11/22 1112 132/84     Pulse Rate 06/11/22 1112 71     Resp --      Temp 06/11/22 1112 98.1 F (36.7 C)     Temp Source 06/11/22 1112 Oral     SpO2 06/11/22 1112 95 %     Weight 06/11/22 1111 230 lb (104.3 kg)     Height 06/11/22 1111 '5\' 11"'$  (1.803 m)     Head Circumference --      Peak Flow --      Pain Score 06/11/22 1111 10     Pain Loc --      Pain Edu? --      Excl. in [place]? --    No data found.  Updated Vital Signs BP 132/84 (BP Location: Left Arm)   Pulse 71   Temp 98.1 F (36.7 C) (Oral)   Ht '5\' 11"'$  (1.803 m)   Wt 230 lb (104.3 kg)   SpO2 95%   BMI 32.08 kg/m   Visual Acuity Right Eye Distance:   Left Eye Distance:   Bilateral Distance:    Right Eye Near:   Left Eye Near:    Bilateral Near:     Physical Exam Constitutional:      Appearance: Normal appearance.  Cardiovascular:     Rate and Rhythm: Normal rate and regular rhythm.  Pulmonary:     Effort: Pulmonary effort is normal.  Musculoskeletal:      Right shoulder: Tenderness (to palpation of muscles above shoulder blade and across shoulder blade) present. No swelling or deformity. Normal range of motion.     Left shoulder: Normal.  Comments: Muscle tightness across shoulder blade. Reported tingling in fingers after muscle palpation. No neck pain or tenderness.   Neurological:     Mental Status: He is alert.      UC Treatments / Results  Labs (all labs ordered are listed, but only abnormal results are displayed) Labs Reviewed - No data to display  EKG   Radiology No results found.  Procedures Procedures (including critical care time)  Medications Ordered in UC Medications - No data to display  Initial Impression / Assessment and Plan / UC Course  I have reviewed the triage vital signs and the nursing notes.  Pertinent labs & imaging results that were available during my care of the patient were reviewed by me and considered in my medical decision making (see chart for details).     Patient reported symptomology and exam.  Ability to muscle soreness and tightness along the lower trapezius as well as muscles attached to the shoulder blade.  Tenderness to palpation with noted muscle spasms.  Plus palpation numbness and tingling to the fingers per patient.  Patient seen by Ortho in the past and has a upcoming appointment with orthopedics at [facility].  He is already on Lodine twice daily as in NSAID.  We will give him a Medrol Dosepak as well as prescription for Zanaflex.  Have him continue the shoulder exercises as provided by orthopedics and then follow-up with Ortho in a couple weeks. Final Clinical Impressions(s) / UC Diagnoses   Final diagnoses:  Chronic right shoulder pain  Strain of right shoulder, initial encounter     Discharge Instructions      -Zanaflex: 1 tablet every 6 hours as needed for muscle spasms.  Would also take nightly to help with sleep -Medrol Dosepak: Take as prescribed on packaging -Continue the  etodolac twice daily -Continue with shoulder exercises as provided by orthopedics -Keep scheduled orthopedic appointment     ED Prescriptions     Medication Sig Dispense Auth. Provider   methylPREDNISolone (MEDROL DOSEPAK) 4 MG TBPK tablet Follow directions on packaging 1 each Luvenia Redden, PA-C   tiZANidine (ZANAFLEX) 4 MG tablet Take 1 tablet (4 mg total) by mouth every 6 (six) hours as needed for muscle spasms. 30 tablet Luvenia Redden, PA-C      PDMP not reviewed this encounter.   Luvenia Redden, PA-C 06/11/22 1139

## 2022-10-01 ENCOUNTER — Emergency Department: Payer: Medicare HMO

## 2022-10-01 ENCOUNTER — Emergency Department
Admission: EM | Admit: 2022-10-01 | Discharge: 2022-10-01 | Disposition: A | Discharge status: Home / Self Care | Payer: Medicare HMO | Attending: Emergency Medicine | Admitting: Emergency Medicine

## 2022-10-01 DIAGNOSIS — I10 Essential (primary) hypertension: Secondary | ICD-10-CM | POA: Insufficient documentation

## 2022-10-01 DIAGNOSIS — S8991XA Unspecified injury of right lower leg, initial encounter: Secondary | ICD-10-CM | POA: Diagnosis present

## 2022-10-01 DIAGNOSIS — S80211A Abrasion, right knee, initial encounter: Secondary | ICD-10-CM | POA: Diagnosis not present

## 2022-10-01 DIAGNOSIS — M542 Cervicalgia: Secondary | ICD-10-CM | POA: Diagnosis not present

## 2022-10-01 DIAGNOSIS — M549 Dorsalgia, unspecified: Secondary | ICD-10-CM | POA: Insufficient documentation

## 2022-10-01 DIAGNOSIS — Y92410 Unspecified street and highway as the place of occurrence of the external cause: Secondary | ICD-10-CM | POA: Insufficient documentation

## 2022-10-01 MED ORDER — OXYCODONE-ACETAMINOPHEN 5-325 MG PO TABS
1.0000 | ORAL_TABLET | Freq: Once | ORAL | Status: AC
  Administered 2022-10-01: 1 via ORAL
  Filled 2022-10-01: qty 1

## 2022-10-01 NOTE — Discharge Instructions (Signed)
The CT scans of your neck and back did not show any acute injuries. You can take tylenol and motrin for pain and rest.
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
## 2022-12-01 ENCOUNTER — Ambulatory Visit
Admission: EM | Admit: 2022-12-01 | Discharge: 2022-12-01 | Disposition: A | Discharge status: Home / Self Care | Payer: Medicare HMO | Attending: Emergency Medicine | Admitting: Emergency Medicine

## 2022-12-01 DIAGNOSIS — N410 Acute prostatitis: Secondary | ICD-10-CM | POA: Diagnosis present

## 2022-12-01 LAB — URINALYSIS, W/ REFLEX TO CULTURE (INFECTION SUSPECTED)
Glucose, UA: NEGATIVE mg/dL
Hgb urine dipstick: NEGATIVE
Ketones, ur: NEGATIVE mg/dL
Leukocytes,Ua: NEGATIVE
Nitrite: NEGATIVE
Protein, ur: NEGATIVE mg/dL
Specific Gravity, Urine: 1.015 (ref 1.005–1.030)
pH: 6 (ref 5.0–8.0)

## 2022-12-01 MED ORDER — CIPROFLOXACIN HCL 500 MG PO TABS: 500.0000 mg | ORAL_TABLET | Freq: Two times a day (BID) | ORAL | 0 refills | Status: AC

## 2022-12-01 NOTE — ED Provider Notes (Signed)
[facility]    CSN: [id number] Arrival date & time: 12/01/22  1910      History   Chief Complaint Chief Complaint  Patient presents with   Dysuria    HPI Marc Weaver is a 72 y.o. male.   HPI  72 year old male here for evaluation of urinary complaints.  The patient has past medical history significant for hypertension, previous UTI with resulting acute kidney injury and sepsis, BPH, hyperglycemia, Peyroine's disease, and GERD who presents for evaluation of burning with urination that started yesterday.  He reports that he felt it 1 time yesterday and then it improved but returned this evening after supper and then again when he was given a urine sample here in clinic.  He states that his urine is pink in color.  He denies any urinary urgency, fever, low back pain, or abdominal pain.  Past Medical History:  Diagnosis Date   AAA (abdominal aortic aneurysm) ([place])    followed by dr Caryl Comes   Atrial myxoma    removed 2008   Barrett's esophagus    BPH (benign prostatic hyperplasia)    Degenerative disc disease, lumbar    arthritis "all-over" back and knees   GERD (gastroesophageal reflux disease)    no meds   Hypercholesteremia    Hyperglycemia    Hypertension    controlled on meds   Numbness and tingling in left hand    Peyronie disease    Sleep apnea    mild by sleep study, no cpap    Patient Active Problem List   Diagnosis Date Noted   UTI (urinary tract infection) 03/13/2021   Sepsis ([place]) 03/13/2021   Hypertension    AKI (acute kidney injury) [facility])     Past Surgical History:  Procedure Laterality Date   CARPAL TUNNEL RELEASE Left 02/09/2015   Procedure: CARPAL TUNNEL RELEASE;  Surgeon: Leanor Kail, MD;  Location: [place];  Service: Orthopedics;  Laterality: Left;   COLONOSCOPY  06/24/2004 AND 07/22/2013   COLONOSCOPY N/A 10/03/2015   Procedure: COLONOSCOPY;  Surgeon: Hulen Luster, MD;  Location: [place];  Service:  Gastroenterology;  Laterality: N/A;   ESOPHAGOGASTRODUODENOSCOPY  07/22/2013   HEARTBURN,HEME-POSITIVE STOOL,POSITIVE BARRETT'S   ESOPHAGOGASTRODUODENOSCOPY N/A 10/03/2015   Procedure: ESOPHAGOGASTRODUODENOSCOPY (EGD);  Surgeon: Hulen Luster, MD;  Location: [place];  Service: Gastroenterology;  Laterality: N/A;   EXCISION OF ATRIAL MYXOMA     KNEE ARTHROSCOPY Right 11/26/2012   partial medial menisectomy and medial chondroplasty   LUMBAR LAMINECTOMY  1988   L4,L5,L6   MAKOPLASTY Right 07/28/2013   MEDIAL COMPARTMENT,partial knee replacement   SHOULDER ARTHROSCOPY WITH OPEN ROTATOR CUFF REPAIR Left 09/11/2017   Procedure: SHOULDER ARTHROSCOPY WITH OPEN ROTATOR CUFF REPAIR,BICEPS TENODEDSIS, AND DECOMPRESSION;  Surgeon: Corky Mull, MD;  Location: [facility];  Service: Orthopedics;  Laterality: Left;   TONSILLECTOMY     WITH ADENOIDECTOMY       Home Medications    Prior to Admission medications   Medication Sig Start Date End Date Taking? Authorizing Provider  acidophilus (RISAQUAD) CAPS capsule Take 1 capsule by mouth daily.   Yes [provider]  aspirin 81 MG tablet Take 81 mg by mouth at bedtime.   Yes [provider]  atorvastatin (LIPITOR) 40 MG tablet Take 40 mg by mouth daily.   Yes [provider]  chlorthalidone (HYGROTON) 25 MG tablet Take 25 mg by mouth daily.   Yes [provider]  ciprofloxacin (CIPRO) 500 MG tablet  Take 1 tablet (500 mg total) by mouth 2 (two) times daily for 14 days. 12/01/22 12/15/22 Yes Margarette Canada, NP  etodolac (LODINE) 500 MG tablet Take 500 mg by mouth daily.   Yes [provider]  losartan (COZAAR) 50 MG tablet Take 50 mg by mouth daily.   Yes [provider]  methylPREDNISolone (MEDROL DOSEPAK) 4 MG TBPK tablet Follow directions on packaging 06/11/22  Yes Luvenia Redden, PA-C  pantoprazole (PROTONIX) 40 MG tablet Take 40 mg by mouth daily.   Yes [provider]  traZODone  (DESYREL) 50 MG tablet Take 50 mg by mouth at bedtime. 07/24/17  Yes [provider]  cloNIDine (CATAPRES) 0.1 MG tablet Take 0.1 mg by mouth at bedtime.  03/13/21  [provider]    Family History Family History  Problem Relation Age of Onset   Stomach cancer Father    Aortic aneurysm Brother     Social History Social History   Tobacco Use   Smoking status: Never   Smokeless tobacco: Never  Vaping Use   Vaping Use: Never used  Substance Use Topics   Alcohol use: Yes    Comment: occ   Drug use: No     Allergies   Gabapentin, Other, and Yeast-related products   Review of Systems Review of Systems  Constitutional:  Negative for fever.  Gastrointestinal:  Negative for abdominal pain.  Genitourinary:  Positive for dysuria and frequency. Negative for urgency.  Musculoskeletal:  Negative for back pain.     Physical Exam Triage Vital Signs ED Triage Vitals  Enc Vitals Group     BP --      Pulse --      Resp --      Temp --      Temp src --      SpO2 --      Weight 12/01/22 1926 225 lb (102.1 kg)     Height 12/01/22 1926 '5\' 9"'$  (1.753 m)     Head Circumference --      Peak Flow --      Pain Score 12/01/22 1925 5     Pain Loc --      Pain Edu? --      Excl. in [place]? --    No data found.  Updated Vital Signs BP 127/85 (BP Location: Left Arm)   Pulse 84   Temp 98.3 F (36.8 C) (Oral)   Resp 16   Ht '5\' 9"'$  (1.753 m)   Wt 225 lb (102.1 kg)   SpO2 95%   BMI 33.23 kg/m   Visual Acuity Right Eye Distance:   Left Eye Distance:   Bilateral Distance:    Right Eye Near:   Left Eye Near:    Bilateral Near:     Physical Exam Vitals and nursing note reviewed.  Constitutional:      Appearance: Normal appearance. He is not ill-appearing.  HENT:     Head: Normocephalic and atraumatic.  Cardiovascular:     Rate and Rhythm: Normal rate and regular rhythm.     Pulses: Normal pulses.     Heart sounds: Normal heart sounds. No murmur heard.     No friction rub. No gallop.  Pulmonary:     Effort: Pulmonary effort is normal.     Breath sounds: Normal breath sounds. No wheezing, rhonchi or rales.  Abdominal:     General: Abdomen is flat.     Palpations: Abdomen is soft.     Tenderness:  There is no abdominal tenderness. There is no right CVA tenderness or left CVA tenderness.  Skin:    General: Skin is warm and dry.     Capillary Refill: Capillary refill takes less than 2 seconds.     Findings: No rash.  Neurological:     General: No focal deficit present.     Mental Status: He is alert and oriented to person, place, and time.      UC Treatments / Results  Labs (all labs ordered are listed, but only abnormal results are displayed) Labs Reviewed  URINALYSIS, W/ REFLEX TO CULTURE (INFECTION SUSPECTED) - Abnormal; Notable for the following components:      Result Value   Bilirubin Urine LARGE (*)    Bacteria, UA FEW (*)    All other components within normal limits    EKG   Radiology No results found.  Procedures Procedures (including critical care time)  Medications Ordered in UC Medications - No data to display  Initial Impression / Assessment and Plan / UC Course  I have reviewed the triage vital signs and the nursing notes.  Pertinent labs & imaging results that were available during my care of the patient were reviewed by me and considered in my medical decision making (see chart for details).   Patient is a pleasant, nontoxic-appearing 72 year old male here for evaluation of UTI symptoms that began yesterday as outlined in HPI above.  His vital signs are stable here and he is afebrile with a temperature of 98.3.  He has a previous history of urosepsis and spent 4 days in the hospital in June 2022 for urosepsis.  At that time his urine culture grew out E. coli.  The patient does have a history of Peyronie's disease and also BPH.  He reports that he does not have a difficulty starting his stream but regularly he  experiences urgency when he has to void.  The burning occurred 1 time yesterday evening and then returned this evening after dinner and again while giving a sample here in clinic.  This is associated with urinary frequency as well.  No CVA tenderness on exam and his abdomen is soft and nontender.  I will order urinalysis to look for the presence of UTI.  Urinalysis shows large bilirubin but no nitrites, leukocyte esterase, or protein.  Reflex microscopy shows 0-5 squamous, 0-5 WBCs, 0-5 RBCs, and few bacteria.   Large bilirubin was present in June 2022 as well.  Given the patient's history of BPH I will treat him for prostatitis with 5 mg of Cipro twice daily for 14 days and refer him to urology for follow-up.   Final Clinical Impressions(s) / UC Diagnoses   Final diagnoses:  Acute prostatitis     Discharge Instructions      Your urinalysis did not show any definitive infection though with your burning and urinary frequency there is concern that this could be your prostate that is infected and causing your symptoms.  Having BPH but you at increased risk for prostatitis.  Take the Cipro 500 mg twice daily for 14 days for treatment of presumptive prostatitis.  Increase your oral fluid intake such increased urine production help flush your urinary tract.  I have made a referral to [facility] urology for further evaluation.  If you develop any fever, abdominal pain, nausea or vomiting I recommend you go to the ER for evaluation given your prior history of urosepsis.     ED Prescriptions     Medication Sig  Dispense Auth. Provider   ciprofloxacin (CIPRO) 500 MG tablet Take 1 tablet (500 mg total) by mouth 2 (two) times daily for 14 days. 28 tablet Margarette Canada, NP      PDMP not reviewed this encounter.   Margarette Canada, NP 12/01/22 1955

## 2022-12-01 NOTE — Discharge Instructions (Addendum)
Your urinalysis did not show any definitive infection though with your burning and urinary frequency there is concern that this could be your prostate that is infected and causing your symptoms.  Having BPH but you at increased risk for prostatitis.  Take the Cipro 500 mg twice daily for 14 days for treatment of presumptive prostatitis.  Increase your oral fluid intake such increased urine production help flush your urinary tract.  I have made a referral to [facility] urology for further evaluation.  If you develop any fever, abdominal pain, nausea or vomiting I recommend you go to the ER for evaluation given your prior history of urosepsis.

## 2022-12-01 NOTE — ED Triage Notes (Signed)
Pt c/o urinary frequency,burning & pain x1 day. Denies any hematuria.

## 2022-12-16 ENCOUNTER — Other Ambulatory Visit: Payer: Self-pay

## 2022-12-16 ENCOUNTER — Other Ambulatory Visit
Admission: RE | Admit: 2022-12-16 | Discharge: 2022-12-16 | Disposition: A | Discharge status: Home / Self Care | Payer: Medicare HMO | Attending: Urology | Admitting: Urology

## 2022-12-16 ENCOUNTER — Ambulatory Visit (INDEPENDENT_AMBULATORY_CARE_PROVIDER_SITE_OTHER): Payer: Medicare HMO | Admitting: Urology

## 2022-12-16 ENCOUNTER — Encounter: Payer: Self-pay | Admitting: Urology

## 2022-12-16 VITALS — BP 147/84 | HR 69 | Ht 70.0 in | Wt 222.0 lb

## 2022-12-16 DIAGNOSIS — Z87438 Personal history of other diseases of male genital organs: Secondary | ICD-10-CM

## 2022-12-16 DIAGNOSIS — R350 Frequency of micturition: Secondary | ICD-10-CM | POA: Insufficient documentation

## 2022-12-16 DIAGNOSIS — N390 Urinary tract infection, site not specified: Secondary | ICD-10-CM

## 2022-12-16 DIAGNOSIS — R300 Dysuria: Secondary | ICD-10-CM | POA: Diagnosis present

## 2022-12-16 DIAGNOSIS — Z87440 Personal history of urinary (tract) infections: Secondary | ICD-10-CM | POA: Diagnosis not present

## 2022-12-16 LAB — URINALYSIS, COMPLETE (UACMP) WITH MICROSCOPIC
Bacteria, UA: NONE SEEN
Glucose, UA: NEGATIVE mg/dL
Ketones, ur: NEGATIVE mg/dL
Nitrite: NEGATIVE
Protein, ur: NEGATIVE mg/dL
Specific Gravity, Urine: 1.01 (ref 1.005–1.030)
pH: 5.5 (ref 5.0–8.0)

## 2022-12-16 LAB — BLADDER SCAN AMB NON-IMAGING

## 2022-12-16 MED ORDER — SULFAMETHOXAZOLE-TRIMETHOPRIM 800-160 MG PO TABS: 1.0000 | ORAL_TABLET | Freq: Two times a day (BID) | ORAL | 0 refills | Status: DC

## 2022-12-16 NOTE — Patient Instructions (Signed)

## 2022-12-16 NOTE — Progress Notes (Signed)
12/16/22 1:11 PM   Marc Weaver 11/18/1950 [id number]  CC: History of UTI, dysuria  HPI: I saw Marc Weaver today for the above issues.  He is here with his wife today who provides most of the history.  He was admitted in June 2022 for sepsis from urinary source with E. coli, CT at that time was benign.  He had done well since that time but developed some mild dysuria and was seen in urgent care recently on 12/01/2022, UA was benign aside from few bacteria, and he was treated with Cipro for possible UTI.  His symptoms resolved within a few days.  He denies any complaints today.  Urinalysis today benign aside from trace leukocytes, PVR normal at 7ml.  He denies any gross hematuria, urinary symptoms, or flank pain.  PSA was normal at 0.51 in December 2023.   PMH: Past Medical History:  Diagnosis Date   AAA (abdominal aortic aneurysm) ([place])    followed by dr Caryl Comes   Atrial myxoma    removed 2008   Barrett's esophagus    BPH (benign prostatic hyperplasia)    Degenerative disc disease, lumbar    arthritis "all-over" back and knees   GERD (gastroesophageal reflux disease)    no meds   Hypercholesteremia    Hyperglycemia    Hypertension    controlled on meds   Numbness and tingling in left hand    Peyronie disease    Sleep apnea    mild by sleep study, no cpap    Surgical History: Past Surgical History:  Procedure Laterality Date   CARPAL TUNNEL RELEASE Left 02/09/2015   Procedure: CARPAL TUNNEL RELEASE;  Surgeon: Leanor Kail, MD;  Location: [place];  Service: Orthopedics;  Laterality: Left;   COLONOSCOPY  06/24/2004 AND 07/22/2013   COLONOSCOPY N/A 10/03/2015   Procedure: COLONOSCOPY;  Surgeon: Hulen Luster, MD;  Location: [place];  Service: Gastroenterology;  Laterality: N/A;   ESOPHAGOGASTRODUODENOSCOPY  07/22/2013   HEARTBURN,HEME-POSITIVE STOOL,POSITIVE BARRETT'S   ESOPHAGOGASTRODUODENOSCOPY N/A 10/03/2015   Procedure: ESOPHAGOGASTRODUODENOSCOPY  (EGD);  Surgeon: Hulen Luster, MD;  Location: [place];  Service: Gastroenterology;  Laterality: N/A;   EXCISION OF ATRIAL MYXOMA     KNEE ARTHROSCOPY Right 11/26/2012   partial medial menisectomy and medial chondroplasty   LUMBAR LAMINECTOMY  1988   L4,L5,L6   MAKOPLASTY Right 07/28/2013   MEDIAL COMPARTMENT,partial knee replacement   SHOULDER ARTHROSCOPY WITH OPEN ROTATOR CUFF REPAIR Left 09/11/2017   Procedure: SHOULDER ARTHROSCOPY WITH OPEN ROTATOR CUFF REPAIR,BICEPS TENODEDSIS, AND DECOMPRESSION;  Surgeon: Corky Mull, MD;  Location: [facility];  Service: Orthopedics;  Laterality: Left;   TONSILLECTOMY     WITH ADENOIDECTOMY   Family History: Family History  Problem Relation Age of Onset   Stomach cancer Father    Aortic aneurysm Brother     Social History:  reports that he has never smoked. He has never used smokeless tobacco. He reports current alcohol use. He reports that he does not use drugs.  Physical Exam: BP (!) 147/84 (BP Location: Left Arm, Patient Position: Sitting, Cuff Size: Large)   Pulse 69   Ht 5\' 10"  (1.778 m)   Wt 222 lb (100.7 kg)   BMI 31.85 kg/m    Constitutional:  Alert and oriented, No acute distress. Cardiovascular: No clubbing, cyanosis, or edema. Respiratory: Normal respiratory effort, no increased work of breathing. GI: Abdomen is soft, nontender, nondistended, no abdominal masses   Laboratory Data: See HPI  Pertinent Imaging:  I have personally viewed and interpreted the CT abdomen and pelvis from June 2022 showing a 37 g prostate, no hydronephrosis or stones.  Assessment & Plan:   72 year old male with history of sepsis from urinary source in June 2022 with normal CT at that time, recent possible UTI/prostatitis with benign urinalysis but symptoms improved with antibiotics.  Urinalysis today essentially benign, PVR normal.  We discussed options including cystoscopy for further evaluation, and he is unsure how he would like to proceed.   Will send urine today for culture as well.  I recommended starting with cranberry tablets for UTI prevention.  Patient will call if he opts to pursue cystoscopy for further evaluation of recurrent UTI   Nickolas Madrid, MD 12/16/2022  [place] [address], [place], [place] [address] [phone]

## 2022-12-17 LAB — URINE CULTURE: Culture: NO GROWTH

## 2022-12-26 ENCOUNTER — Ambulatory Visit
Admission: EM | Admit: 2022-12-26 | Discharge: 2022-12-26 | Disposition: A | Discharge status: Home / Self Care | Payer: Medicare HMO | Attending: Emergency Medicine | Admitting: Emergency Medicine

## 2022-12-26 ENCOUNTER — Telehealth: Payer: Self-pay | Admitting: Emergency Medicine

## 2022-12-26 DIAGNOSIS — Z1152 Encounter for screening for COVID-19: Secondary | ICD-10-CM | POA: Insufficient documentation

## 2022-12-26 DIAGNOSIS — R062 Wheezing: Secondary | ICD-10-CM | POA: Diagnosis not present

## 2022-12-26 DIAGNOSIS — R059 Cough, unspecified: Secondary | ICD-10-CM | POA: Insufficient documentation

## 2022-12-26 DIAGNOSIS — J069 Acute upper respiratory infection, unspecified: Secondary | ICD-10-CM | POA: Diagnosis not present

## 2022-12-26 LAB — RESP PANEL BY RT-PCR (RSV, FLU A&B, COVID)  RVPGX2
Influenza A by PCR: POSITIVE — AB
Influenza B by PCR: NEGATIVE
Resp Syncytial Virus by PCR: NEGATIVE
SARS Coronavirus 2 by RT PCR: NEGATIVE

## 2022-12-26 MED ORDER — AZITHROMYCIN 250 MG PO TABS: 250.0000 mg | ORAL_TABLET | Freq: Every day | ORAL | 0 refills | Status: DC

## 2022-12-26 MED ORDER — GUAIFENESIN-CODEINE 100-10 MG/5ML PO SOLN: 5.0000 mL | Freq: Four times a day (QID) | ORAL | 0 refills | Status: DC | PRN

## 2022-12-26 MED ORDER — PREDNISONE 20 MG PO TABS: 40.0000 mg | ORAL_TABLET | Freq: Every day | ORAL | 0 refills | Status: DC

## 2022-12-26 NOTE — ED Triage Notes (Signed)
Pt c/o cough, nasal drainage, chest congestion x1week  Pt states that his coughing worsened last night  Pt denies nausea, vomiting, body aches, sore throat, SOB  Pt has taken robitussin and sudafed and they have not helped.

## 2022-12-26 NOTE — ED Provider Notes (Signed)
[facility]    CSN: [id number] Arrival date & time: 12/26/22  1815      History   Chief Complaint Chief Complaint  Patient presents with   Cough    HPI Marc Weaver is a 72 y.o. male.   Patient presents for evaluation of nasal congestion, rhinorrhea, sore throat, productive cough and intermittent wheezing present for 7 days.  Cough has significantly worsened over the last 2 days, harsh and persistent, interfering with sleep.  Wheezing is heard primarily with coughing.  No known sick contacts prior.  Denies fever, chills, body aches, shortness of breath.  Has attempted use of Tessalon, Sudafed and Robitussin.  Denies respiratory history.  Past Medical History:  Diagnosis Date   AAA (abdominal aortic aneurysm) ([place])    followed by dr Caryl Comes   Atrial myxoma    removed 2008   Barrett's esophagus    BPH (benign prostatic hyperplasia)    Degenerative disc disease, lumbar    arthritis "all-over" back and knees   GERD (gastroesophageal reflux disease)    no meds   Hypercholesteremia    Hyperglycemia    Hypertension    controlled on meds   Numbness and tingling in left hand    Peyronie disease    Sleep apnea    mild by sleep study, no cpap    Patient Active Problem List   Diagnosis Date Noted   UTI (urinary tract infection) 03/13/2021   Sepsis ([place]) 03/13/2021   Hypertension    AKI (acute kidney injury) [facility])     Past Surgical History:  Procedure Laterality Date   CARPAL TUNNEL RELEASE Left 02/09/2015   Procedure: CARPAL TUNNEL RELEASE;  Surgeon: Leanor Kail, MD;  Location: [place];  Service: Orthopedics;  Laterality: Left;   COLONOSCOPY  06/24/2004 AND 07/22/2013   COLONOSCOPY N/A 10/03/2015   Procedure: COLONOSCOPY;  Surgeon: Hulen Luster, MD;  Location: [place];  Service: Gastroenterology;  Laterality: N/A;   ESOPHAGOGASTRODUODENOSCOPY  07/22/2013   HEARTBURN,HEME-POSITIVE STOOL,POSITIVE BARRETT'S   ESOPHAGOGASTRODUODENOSCOPY N/A  10/03/2015   Procedure: ESOPHAGOGASTRODUODENOSCOPY (EGD);  Surgeon: Hulen Luster, MD;  Location: [place];  Service: Gastroenterology;  Laterality: N/A;   EXCISION OF ATRIAL MYXOMA     KNEE ARTHROSCOPY Right 11/26/2012   partial medial menisectomy and medial chondroplasty   LUMBAR LAMINECTOMY  1988   L4,L5,L6   MAKOPLASTY Right 07/28/2013   MEDIAL COMPARTMENT,partial knee replacement   SHOULDER ARTHROSCOPY WITH OPEN ROTATOR CUFF REPAIR Left 09/11/2017   Procedure: SHOULDER ARTHROSCOPY WITH OPEN ROTATOR CUFF REPAIR,BICEPS TENODEDSIS, AND DECOMPRESSION;  Surgeon: Corky Mull, MD;  Location: [facility];  Service: Orthopedics;  Laterality: Left;   TONSILLECTOMY     WITH ADENOIDECTOMY       Home Medications    Prior to Admission medications   Medication Sig Start Date End Date Taking? Authorizing Provider  aspirin 81 MG tablet Take 81 mg by mouth at bedtime.   Yes [provider]  atorvastatin (LIPITOR) 40 MG tablet Take 40 mg by mouth daily.   Yes [provider]  chlorthalidone (HYGROTON) 25 MG tablet Take 25 mg by mouth daily.   Yes [provider]  cloNIDine (CATAPRES) 0.1 MG tablet Take 0.1 mg by mouth daily.   Yes [provider]  Cranberry 125 MG TABS Take by mouth.   Yes [provider]  etodolac (LODINE) 500 MG tablet Take 500 mg by mouth daily.   Yes [provider]  losartan (COZAAR) 50 MG  tablet Take 50 mg by mouth daily.   Yes [provider]  pantoprazole (PROTONIX) 40 MG tablet Take 40 mg by mouth daily.   Yes [provider]  sulfamethoxazole-trimethoprim (BACTRIM DS) 800-160 MG tablet Take 1 tablet by mouth 2 (two) times daily. 12/16/22  Yes Billey Co, MD  traZODone (DESYREL) 50 MG tablet Take 50 mg by mouth at bedtime. 07/24/17  Yes [provider]    Family History Family History  Problem Relation Age of Onset   Stomach cancer Father    Aortic aneurysm Brother     Social  History Social History   Tobacco Use   Smoking status: Never   Smokeless tobacco: Never  Vaping Use   Vaping Use: Never used  Substance Use Topics   Alcohol use: Yes    Comment: occ   Drug use: No     Allergies   Gabapentin, Other, and Yeast-related products   Review of Systems Review of Systems  HENT:  Positive for congestion, rhinorrhea and sore throat. Negative for dental problem, drooling, ear discharge, ear pain, facial swelling, hearing loss, mouth sores, nosebleeds, postnasal drip, sinus pressure, sinus pain, sneezing, tinnitus, trouble swallowing and voice change.   Respiratory:  Positive for cough and wheezing. Negative for apnea, choking, chest tightness, shortness of breath and stridor.   Cardiovascular: Negative.   Gastrointestinal: Negative.   Skin: Negative.      Physical Exam Triage Vital Signs ED Triage Vitals  Enc Vitals Group     BP 12/26/22 1830 130/83     Pulse Rate 12/26/22 1830 81     Resp --      Temp 12/26/22 1830 98.6 F (37 C)     Temp Source 12/26/22 1830 Oral     SpO2 12/26/22 1830 95 %     Weight 12/26/22 1829 229 lb (103.9 kg)     Height 12/26/22 1829 5\' 9"  (1.753 m)     Head Circumference --      Peak Flow --      Pain Score 12/26/22 1829 0     Pain Loc --      Pain Edu? --      Excl. in [place]? --    No data found.  Updated Vital Signs BP 130/83 (BP Location: Left Arm)   Pulse 81   Temp 98.6 F (37 C) (Oral)   Ht 5\' 9"  (1.753 m)   Wt 229 lb (103.9 kg)   SpO2 95%   BMI 33.82 kg/m   Visual Acuity Right Eye Distance:   Left Eye Distance:   Bilateral Distance:    Right Eye Near:   Left Eye Near:    Bilateral Near:     Physical Exam Constitutional:      Appearance: Normal appearance.  HENT:     Head: Normocephalic.     Right Ear: Tympanic membrane, ear canal and external ear normal.     Left Ear: Tympanic membrane, ear canal and external ear normal.     Nose: Congestion and rhinorrhea present.     Mouth/Throat:      Mouth: Mucous membranes are moist.     Pharynx: No posterior oropharyngeal erythema.  Eyes:     Extraocular Movements: Extraocular movements intact.  Cardiovascular:     Rate and Rhythm: Normal rate and regular rhythm.     Pulses: Normal pulses.     Heart sounds: Normal heart sounds.  Pulmonary:     Effort: Pulmonary effort is normal.  Breath sounds: Normal breath sounds.  Skin:    General: Skin is warm and dry.  Neurological:     Mental Status: He is alert and oriented to person, place, and time. Mental status is at baseline.  Psychiatric:        Mood and Affect: Mood normal.        Behavior: Behavior normal.      UC Treatments / Results  Labs (all labs ordered are listed, but only abnormal results are displayed) Labs Reviewed - No data to display  EKG   Radiology No results found.  Procedures Procedures (including critical care time)  Medications Ordered in UC Medications - No data to display  Initial Impression / Assessment and Plan / UC Course  I have reviewed the triage vital signs and the nursing notes.  Pertinent labs & imaging results that were available during my care of the patient were reviewed by me and considered in my medical decision making (see chart for details).  Acute upper respiratory infection  Patient is in no signs of distress nor toxic appearing.  Vital signs are stable.  Low suspicion for pneumonia, pneumothorax or bronchitis and therefore will defer imaging.  Viral testing pending as he will be around high risk individuals during the holiday weekend.,  Discussing his past use of quarantine and antivirals if positive.  Prescribed azithromycin for symptoms progressively worsening as well as prednisone and guaifenesin codeine for additional supportive care.May use additional over-the-counter medications as needed for supportive care.  May follow-up with urgent care as needed if symptoms persist or worsen.  Note given.   Final Clinical  Impressions(s) / UC Diagnoses   Final diagnoses:  None   Discharge Instructions   None    ED Prescriptions   None    PDMP not reviewed this encounter.   Hans Eden, NP 12/26/22 1904

## 2022-12-26 NOTE — Telephone Encounter (Signed)
Spoke to patient's spouse, okay'd to relay information, 2 patient identifiers used, notified of influenza A positive test results, discussed transmission, past use of Tamiflu, may continue to use prescribed medication as directed, may follow-up with his urgent care as needed

## 2022-12-26 NOTE — Discharge Instructions (Signed)
Begin use of azithromycin as directed, this medicine is an antibiotic and ideally will reduce any bacteria that may be adding to your symptoms  Starting tomorrow take prednisone every morning with food for 5 days to help relax the airway which should help with your harsh cough and wheezing  You may continue use of Tessalon Perles as needed  You may use guaifenesin codeine every 6 hours as needed for additional comfort, be mindful this will make you feel drowsy    You can take Tylenol and/or Ibuprofen as needed for fever reduction and pain relief.   For cough: honey 1/2 to 1 teaspoon (you can dilute the honey in water or another fluid).  You can also use guaifenesin and dextromethorphan for cough. You can use a humidifier for chest congestion and cough.  If you don't have a humidifier, you can sit in the bathroom with the hot shower running.      For sore throat: try warm salt water gargles, cepacol lozenges, throat spray, warm tea or water with lemon/honey, popsicles or ice, or OTC cold relief medicine for throat discomfort.   For congestion: take a daily anti-histamine like Zyrtec, Claritin, and a oral decongestant, such as pseudoephedrine.  You can also use Flonase 1-2 sprays in each nostril daily.   It is important to stay hydrated: drink plenty of fluids (water, gatorade/powerade/pedialyte, juices, or teas) to keep your throat moisturized and help further relieve irritation/discomfort.

## 2023-10-19 ENCOUNTER — Other Ambulatory Visit: Payer: Self-pay | Admitting: Internal Medicine

## 2023-10-19 DIAGNOSIS — I714 Abdominal aortic aneurysm, without rupture, unspecified: Secondary | ICD-10-CM

## 2023-10-22 ENCOUNTER — Ambulatory Visit
Admission: RE | Admit: 2023-10-22 | Discharge: 2023-10-22 | Disposition: A | Discharge status: Home / Self Care | Payer: Medicare Other | Source: Ambulatory Visit | Attending: Internal Medicine | Admitting: Internal Medicine

## 2023-10-22 DIAGNOSIS — I714 Abdominal aortic aneurysm, without rupture, unspecified: Secondary | ICD-10-CM | POA: Diagnosis present

## 2024-02-08 ENCOUNTER — Other Ambulatory Visit: Payer: Self-pay | Admitting: Internal Medicine

## 2024-02-08 DIAGNOSIS — R200 Anesthesia of skin: Secondary | ICD-10-CM

## 2024-02-08 DIAGNOSIS — G959 Disease of spinal cord, unspecified: Secondary | ICD-10-CM

## 2024-02-12 ENCOUNTER — Ambulatory Visit
Admission: RE | Admit: 2024-02-12 | Discharge: 2024-02-12 | Disposition: A | Discharge status: Home / Self Care | Source: Ambulatory Visit | Attending: Internal Medicine | Admitting: Internal Medicine

## 2024-02-12 ENCOUNTER — Other Ambulatory Visit: Payer: Self-pay | Admitting: Internal Medicine

## 2024-02-12 DIAGNOSIS — Z5309 Procedure and treatment not carried out because of other contraindication: Secondary | ICD-10-CM

## 2024-02-12 DIAGNOSIS — G959 Disease of spinal cord, unspecified: Secondary | ICD-10-CM | POA: Diagnosis present

## 2024-02-12 DIAGNOSIS — R200 Anesthesia of skin: Secondary | ICD-10-CM | POA: Diagnosis present

## 2024-05-11 DIAGNOSIS — M51379 Other intervertebral disc degeneration, lumbosacral region without mention of lumbar back pain or lower extremity pain: Secondary | ICD-10-CM | POA: Insufficient documentation

## 2024-05-11 DIAGNOSIS — E785 Hyperlipidemia, unspecified: Secondary | ICD-10-CM | POA: Insufficient documentation

## 2024-05-11 DIAGNOSIS — Z86018 Personal history of other benign neoplasm: Secondary | ICD-10-CM | POA: Insufficient documentation

## 2024-05-11 DIAGNOSIS — Z9889 Other specified postprocedural states: Secondary | ICD-10-CM | POA: Insufficient documentation

## 2024-05-11 DIAGNOSIS — K2270 Barrett's esophagus without dysplasia: Secondary | ICD-10-CM | POA: Insufficient documentation

## 2024-05-11 DIAGNOSIS — N486 Induration penis plastica: Secondary | ICD-10-CM | POA: Insufficient documentation

## 2024-05-11 DIAGNOSIS — G4730 Sleep apnea, unspecified: Secondary | ICD-10-CM | POA: Insufficient documentation

## 2024-05-11 DIAGNOSIS — E7800 Pure hypercholesterolemia, unspecified: Secondary | ICD-10-CM | POA: Insufficient documentation

## 2024-05-11 DIAGNOSIS — K5790 Diverticulosis of intestine, part unspecified, without perforation or abscess without bleeding: Secondary | ICD-10-CM | POA: Insufficient documentation

## 2024-05-11 DIAGNOSIS — N400 Enlarged prostate without lower urinary tract symptoms: Secondary | ICD-10-CM | POA: Insufficient documentation

## 2024-05-11 NOTE — Progress Notes (Addendum)
 Referring Physician:  Dodson Delon FERNS, MD [address] [place],  [place] [address]  Primary Physician:  Fernande Ophelia JINNY DOUGLAS, MD  History of Present Illness: 05/17/2024 Mr. Marc Weaver has a history of HTN, atrial myxoma, BPH, diverticulosis, GERD, peyronie's disease, hypercholesterolemia, mild OSA, AAA, prediabetes.   History of lumbar laminectomy in 1988 and lumbar surgery in 1984.   Has been seeing PMR for neck and right arm pain. Did not want to consider cervical injections.   He has intermittent numbness/tingling in his right arm to his thumb, index, and middle finger that can be uncomfortable but not painful. He has no pain in his neck.  Numbness, weakness, and tingling in right arm is better with using his arm (yard work) and worse at rest. Numbness is worse at night, cannot lay on right side.   Only taking neurontin at night- it did not help so he stopped it. He is seeing some relief with flexeril.   Tobacco use: Does not smoke.   Bowel/Bladder Dysfunction: none  Conservative measures:  Physical therapy:  referral was placed, but he is not scheduled yet. He's done in the past for other areas with no relief.  Multimodal medical therapy including regular antiinflammatories: = gabapentin, etodolac, flexeril, tylenol  Injections:  no epidural steroid injections, he is not interested in these.   Past Surgery:  Lumbar Laminectomy in 1988 History of right reverse TSA and left shoulder scope  Marc Weaver has no symptoms of cervical myelopathy.  The symptoms are causing a significant impact on the patient's life.   Review of Systems:  A 10 point review of systems is negative, except for the pertinent positives and negatives detailed in the HPI.  Past Medical History: Past Medical History:  Diagnosis Date   AAA (abdominal aortic aneurysm) (HCC)    followed by dr fernande   Atrial myxoma    removed 2008   Barrett's esophagus    BPH (benign prostatic hyperplasia)     Degenerative disc disease, lumbar    arthritis all-over back and knees   GERD (gastroesophageal reflux disease)    no meds   Hypercholesteremia    Hyperglycemia    Hypertension    controlled on meds   Numbness and tingling in left hand    Peyronie disease    Sleep apnea    mild by sleep study, no cpap    Past Surgical History: Past Surgical History:  Procedure Laterality Date   CARPAL TUNNEL RELEASE Left 02/09/2015   Procedure: CARPAL TUNNEL RELEASE;  Surgeon: Helayne Glenn, MD;  Location: [facility];  Service: Orthopedics;  Laterality: Left;   COLONOSCOPY  06/24/2004 AND 07/22/2013   COLONOSCOPY N/A 10/03/2015   Procedure: COLONOSCOPY;  Surgeon: Deward CINDERELLA Piedmont, MD;  Location: [facility];  Service: Gastroenterology;  Laterality: N/A;   ESOPHAGOGASTRODUODENOSCOPY  07/22/2013   HEARTBURN,HEME-POSITIVE STOOL,POSITIVE BARRETT'S   ESOPHAGOGASTRODUODENOSCOPY N/A 10/03/2015   Procedure: ESOPHAGOGASTRODUODENOSCOPY (EGD);  Surgeon: Deward CINDERELLA Piedmont, MD;  Location: [facility];  Service: Gastroenterology;  Laterality: N/A;   EXCISION OF ATRIAL MYXOMA     KNEE ARTHROSCOPY Right 11/26/2012   partial medial menisectomy and medial chondroplasty   LUMBAR LAMINECTOMY  1988   L4,L5,L6   MAKOPLASTY Right 07/28/2013   MEDIAL COMPARTMENT,partial knee replacement   SHOULDER ARTHROSCOPY WITH OPEN ROTATOR CUFF REPAIR Left 09/11/2017   Procedure: SHOULDER ARTHROSCOPY WITH OPEN ROTATOR CUFF REPAIR,BICEPS TENODEDSIS, AND DECOMPRESSION;  Surgeon: Edie Norleen JINNY, MD;  Location: [facility];  Service: Orthopedics;  Laterality: Left;   TONSILLECTOMY     WITH ADENOIDECTOMY    Allergies: Allergies as of 05/17/2024 - Review Complete 05/17/2024  Allergen Reaction Noted   Gabapentin Other (See Comments) 02/07/2015   Other Hives 02/07/2015   Yeast-derived drug products Hives 02/07/2015    Medications: Outpatient Encounter Medications as of 05/17/2024  Medication Sig   aspirin  81 MG tablet  Take 81 mg by mouth at bedtime.   atorvastatin  (LIPITOR) 40 MG tablet Take 40 mg by mouth daily.   chlorthalidone (HYGROTON) 25 MG tablet Take 25 mg by mouth daily.   Cranberry 125 MG TABS Take by mouth.   cyclobenzaprine (FLEXERIL) 5 MG tablet Take 5 mg by mouth at bedtime as needed.   etodolac (LODINE) 500 MG tablet Take 500 mg by mouth daily.   losartan  (COZAAR ) 50 MG tablet Take 50 mg by mouth daily.   pantoprazole  (PROTONIX ) 40 MG tablet Take 40 mg by mouth daily.   traZODone  (DESYREL ) 50 MG tablet Take 50 mg by mouth at bedtime.   [DISCONTINUED] azithromycin  (ZITHROMAX ) 250 MG tablet Take 1 tablet (250 mg total) by mouth daily. Take first 2 tablets together, then 1 every day until finished.   [DISCONTINUED] cloNIDine  (CATAPRES ) 0.1 MG tablet Take 0.1 mg by mouth daily.   [DISCONTINUED] guaiFENesin -codeine  100-10 MG/5ML syrup Take 5 mLs by mouth every 6 (six) hours as needed for cough.   [DISCONTINUED] predniSONE  (DELTASONE ) 20 MG tablet Take 2 tablets (40 mg total) by mouth daily.   [DISCONTINUED] sulfamethoxazole -trimethoprim  (BACTRIM  DS) 800-160 MG tablet Take 1 tablet by mouth 2 (two) times daily.   No facility-administered encounter medications on file as of 05/17/2024.    Social History: Social History   Tobacco Use   Smoking status: Never   Smokeless tobacco: Never  Vaping Use   Vaping status: Never Used  Substance Use Topics   Alcohol use: Yes    Comment: occ   Drug use: No    Family Medical History: Family History  Problem Relation Age of Onset   Stomach cancer Father    Aortic aneurysm Brother     Physical Examination: Vitals:   05/17/24 1340  BP: 136/84    General: Patient is well developed, well nourished, calm, collected, and in no apparent distress. Attention to examination is appropriate.  Respiratory: Patient is breathing without any difficulty.   NEUROLOGICAL:     Awake, alert, oriented to person, place, and time.  Speech is clear and fluent.  Fund of knowledge is appropriate.   Cranial Nerves: Pupils equal round and reactive to light.  Facial tone is symmetric.    No posterior cervical tenderness. Mild tenderness in bilateral trapezial region.   Good ROM of both shoulders with no pain.   No abnormal lesions on exposed skin.   Strength: Side Biceps Triceps Deltoid Interossei Grip Wrist Ext. Wrist Flex.  R 5 5 5 5 5 5 5   L 5 5 5 5 5 5 5    Side Iliopsoas Quads Hamstring PF DF EHL  R 5 5 5 5 5 5   L 5 5 5 5 5 5    Reflexes are 2+ and symmetric at the biceps, brachioradialis, patella and achilles.   Hoffman's is absent.  Clonus is not present.   Bilateral upper and lower extremity sensation is intact to light touch.     Gait is normal.     Medical Decision Making  Imaging: Cervical MRI dated 02/12/24:  FINDINGS: Alignment: There is mild reversal of the normal  cervical lordosis and 0.3 cm anterolisthesis C2 on C3 and C7 on T1.   Vertebrae: No fracture, worrisome lesion or evidence of discitis. Hemangioma in C5 is incidentally noted.   Cord: Normal signal throughout.   Posterior Fossa, vertebral arteries, paraspinal tissues: Negative.   Disc levels:   C2-3: Mild-to-moderate facet degenerative disease is worse on the left. There is moderate left foraminal narrowing. The right foramen and central canal are open.   C3-4: Shallow disc bulge with uncovertebral disease and mild-to-moderate facet degenerative change. There is mild flattening of the ventral cord. Moderately severe to severe foraminal narrowing is worse on the left.   C4-5: Advanced facet degenerative change on the right is present. There is a shallow disc bulge and uncovertebral spurring. Ligamentum flavum thickening also noted. The ventral cord is mildly flattened. Moderately severe to severe foraminal narrowing is worse on the right.   C5-6: Disc osteophyte complex, uncovertebral spurring and ligamentum flavum thickening are seen. There is mild  flattening of the ventral cord. Severe bilateral foraminal narrowing is present.   C6-7: Advanced facet degenerative change on the right. There is a shallow left paracentral protrusion and some uncovertebral spurring. Mild left foraminal narrowing is present. The central canal and right foramen are open.   C7-T1: Bilateral facet degenerative change. There is slight disc uncovering. The central canal and foramina are open.   IMPRESSION: 1. Multilevel cervical spondylosis appears worst at C5-6 where there is mild flattening of the ventral cord and severe bilateral foraminal narrowing. 2. Moderately severe to severe foraminal narrowing at C3-4 and C4-5 is worse on the left at C3-4 and on the right at C4-5. There is mild flattening of the ventral cord at both levels. 3. Moderate left foraminal narrowing at C2-3.     Electronically Signed   By: Debby Prader M.D.   On: 03/13/2024 08:55    I have personally reviewed the images and agree with the above interpretation.  Assessment and Plan: Marc Weaver has intermittent numbness/tingling in his right arm to his thumb, index, and middle finger that can be uncomfortable but not painful. He has no pain in his neck.  Numbness, weakness, and tingling in right arm is better with using his arm (yard work) and worse at rest. Numbness is worse at night, cannot lay on right side.   He has known cervical spondylosis with mild central stenosis C3-C6. He has multilevel bilateral foraminal stenosis that is moderate/severe C3-C6. Appears to have slip at C2-C3 and C7-T1.  Treatment options discussed with patient and following plan made:   - Cervical xrays with flex/ext to evaluate possible slip at C2-C3 and C7-T1. Will message him with results.  - Discussed PT for cervical spine. He declines for now.  - Continue on prn flexeril from PCP. He is taking 5mg  q hs prn.  Reviewed dosing and side effects. Discussed this can cause drowsiness.  - Will discuss  with Dr. Claudene next week regarding possible injections and/or EMG. Will message him with further plan by Tuesday.   I spent a total of 35 minutes in face-to-face and non-face-to-face activities related to this patient's care today including review of outside records, review of imaging, review of symptoms, physical exam, discussion of differential diagnosis, discussion of treatment options, and documentation.   Thank you for involving me in the care of this patient.   ADDENDUM 05/24/24:  Reviewed with Dr. Claudene. Injection would help with any pain/discomfort in his arm, but would not help with loss of sensation.  He agrees with EMG. Will send patient a message.  Glade Boys PA-C Dept. of Neurosurgery

## 2024-05-17 ENCOUNTER — Ambulatory Visit
Admission: RE | Admit: 2024-05-17 | Discharge: 2024-05-17 | Disposition: A | Discharge status: Home / Self Care | Attending: Orthopedic Surgery | Admitting: Orthopedic Surgery

## 2024-05-17 ENCOUNTER — Encounter: Payer: Self-pay | Admitting: Orthopedic Surgery

## 2024-05-17 ENCOUNTER — Ambulatory Visit: Admitting: Orthopedic Surgery

## 2024-05-17 ENCOUNTER — Ambulatory Visit
Admission: RE | Admit: 2024-05-17 | Discharge: 2024-05-17 | Disposition: A | Discharge status: Home / Self Care | Source: Ambulatory Visit | Attending: Orthopedic Surgery | Admitting: Orthopedic Surgery

## 2024-05-17 VITALS — BP 136/84 | Ht 69.0 in | Wt 203.0 lb

## 2024-05-17 DIAGNOSIS — M47812 Spondylosis without myelopathy or radiculopathy, cervical region: Secondary | ICD-10-CM | POA: Diagnosis not present

## 2024-05-17 DIAGNOSIS — R200 Anesthesia of skin: Secondary | ICD-10-CM

## 2024-05-17 DIAGNOSIS — M4802 Spinal stenosis, cervical region: Secondary | ICD-10-CM | POA: Diagnosis not present

## 2024-05-17 DIAGNOSIS — M5021 Other cervical disc displacement,  high cervical region: Secondary | ICD-10-CM | POA: Diagnosis not present

## 2024-05-17 DIAGNOSIS — M5023 Other cervical disc displacement, cervicothoracic region: Secondary | ICD-10-CM | POA: Diagnosis not present

## 2024-05-17 DIAGNOSIS — M5412 Radiculopathy, cervical region: Secondary | ICD-10-CM

## 2024-05-17 NOTE — Patient Instructions (Signed)
 It was so nice to see you today. Thank you so much for coming in.    You have some wear and tear in your neck (arthritis) with some irritation of the nerves on both the right and left sides.   I ordered xrays of your neck. You can get these at [facility]. You do not need any appointment. I will message you with results.   Continue on the flexeril as prescribed to help with muscle spasms. This can make you sleepy.   I will review everything with Dr. Claudene next week and message you with his recommendations. You should hear from me by Tuesday.   Please do not hesitate to call if you have any questions or concerns. You can also message me in MyChart.   Glade Boys PA-C [phone]     The physicians and staff at [facility] at [facility] are committed to providing excellent care. You may receive a survey asking for feedback about your experience at our office. We value you your feedback and appreciate you taking the time to to fill it out. The [facility] leadership team is also available to discuss your experience in person, feel free to contact us  [phone].

## 2024-05-24 DIAGNOSIS — M47812 Spondylosis without myelopathy or radiculopathy, cervical region: Secondary | ICD-10-CM

## 2024-05-24 DIAGNOSIS — M5412 Radiculopathy, cervical region: Secondary | ICD-10-CM

## 2024-05-24 DIAGNOSIS — R200 Anesthesia of skin: Secondary | ICD-10-CM

## 2024-05-29 ENCOUNTER — Telehealth: Payer: Self-pay | Admitting: Orthopedic Surgery

## 2024-05-29 NOTE — Telephone Encounter (Signed)
 Put in order for EMG of bilateral upper extremities with Dr. Maree at [facility].

## 2024-05-29 NOTE — Addendum Note (Signed)
 Addended byBETHA HILMA HASTINGS on: 05/29/2024 06:05 PM   Modules accepted: Orders

## 2024-05-30 ENCOUNTER — Encounter: Payer: Self-pay | Admitting: Orthopedic Surgery

## 2024-05-30 NOTE — Telephone Encounter (Signed)
 Cervical xrays dated 05/17/24:  FINDINGS: Normal alignment without acute osseous finding, fracture, subluxation or dislocation. Normal prevertebral soft tissues. Similar mild-to-moderate degenerative disc disease most pronounced at C3-4 and C5-6 with endplate irregularity, bony spurring and disc space narrowing. Mild posterior multilevel facet arthropathy. No instability with limited flexion extension. Carotid calcifications noted. Trachea midline. Lung apices clear. Aortic atherosclerosis also noted.   IMPRESSION: Degenerative changes as above. No acute finding by plain radiography.     Electronically Signed   By: CHRISTELLA.  Shick M.D.   On: 05/29/2024 17:24  I have personally reviewed the images and agree with the above interpretation.  Message sent to patient.

## 2024-05-31 NOTE — Telephone Encounter (Signed)
Order faxed to [facility]

## 2024-09-19 ENCOUNTER — Encounter: Payer: Self-pay | Admitting: Dermatology

## 2024-09-19 ENCOUNTER — Ambulatory Visit: Admitting: Dermatology

## 2024-09-19 DIAGNOSIS — L738 Other specified follicular disorders: Secondary | ICD-10-CM

## 2024-09-19 DIAGNOSIS — L814 Other melanin hyperpigmentation: Secondary | ICD-10-CM

## 2024-09-19 DIAGNOSIS — D099 Carcinoma in situ, unspecified: Secondary | ICD-10-CM

## 2024-09-19 DIAGNOSIS — L821 Other seborrheic keratosis: Secondary | ICD-10-CM

## 2024-09-19 DIAGNOSIS — L918 Other hypertrophic disorders of the skin: Secondary | ICD-10-CM | POA: Diagnosis not present

## 2024-09-19 DIAGNOSIS — W908XXA Exposure to other nonionizing radiation, initial encounter: Secondary | ICD-10-CM | POA: Diagnosis not present

## 2024-09-19 DIAGNOSIS — D1801 Hemangioma of skin and subcutaneous tissue: Secondary | ICD-10-CM

## 2024-09-19 DIAGNOSIS — D229 Melanocytic nevi, unspecified: Secondary | ICD-10-CM

## 2024-09-19 DIAGNOSIS — L570 Actinic keratosis: Secondary | ICD-10-CM | POA: Diagnosis not present

## 2024-09-19 DIAGNOSIS — L719 Rosacea, unspecified: Secondary | ICD-10-CM | POA: Diagnosis not present

## 2024-09-19 DIAGNOSIS — L578 Other skin changes due to chronic exposure to nonionizing radiation: Secondary | ICD-10-CM

## 2024-09-19 DIAGNOSIS — L219 Seborrheic dermatitis, unspecified: Secondary | ICD-10-CM

## 2024-09-19 DIAGNOSIS — D044 Carcinoma in situ of skin of scalp and neck: Secondary | ICD-10-CM

## 2024-09-19 DIAGNOSIS — Z1283 Encounter for screening for malignant neoplasm of skin: Secondary | ICD-10-CM

## 2024-09-19 DIAGNOSIS — I872 Venous insufficiency (chronic) (peripheral): Secondary | ICD-10-CM | POA: Diagnosis not present

## 2024-09-19 DIAGNOSIS — D489 Neoplasm of uncertain behavior, unspecified: Secondary | ICD-10-CM | POA: Diagnosis not present

## 2024-09-19 HISTORY — DX: Carcinoma in situ, unspecified: D09.9

## 2024-09-19 MED ORDER — METRONIDAZOLE 0.75 % EX CREA: TOPICAL_CREAM | CUTANEOUS | 2 refills | Status: AC

## 2024-09-19 NOTE — Progress Notes (Signed)
 "  New Patient Visit   Subjective  Marc Weaver is a 73 y.o. male who presents for the following: Skin Cancer Screening and Full Body Skin Exam Patient has scaly places on scalp, and moles on chest and under arms. Concerned skin texture on left lower leg. Patient states he also has some pimple like bumps appear on nose. This patient is accompanied in the office by his spouse.  The patient presents for Total-Body Skin Exam (TBSE) for skin cancer screening and mole check. The patient has spots, moles and lesions to be evaluated, some may be new or changing and the patient may have concern these could be cancer.    The following portions of the chart were reviewed this encounter and updated as appropriate: medications, allergies, medical history  Review of Systems:  No other skin or systemic complaints except as noted in HPI or Assessment and Plan.  Objective  Well appearing patient in no apparent distress; mood and affect are within normal limits.  A full examination was performed including scalp, head, eyes, ears, nose, lips, neck, chest, axillae, abdomen, back, buttocks, bilateral upper extremities, bilateral lower extremities, hands, feet, fingers, toes, fingernails, and toenails. All findings within normal limits unless otherwise noted below.   Relevant physical exam findings are noted in the Assessment and Plan.  Left Frontal Scalp 6 mm pink scaly papule  Right lower posterior leg 4 mm brown to black macule  Right chest (2) Fleshy, skin-colored pedunculated papules.    Assessment & Plan   SKIN CANCER SCREENING PERFORMED TODAY.  ACTINIC DAMAGE - Chronic condition, secondary to cumulative UV/sun exposure - diffuse scaly erythematous macules with underlying dyspigmentation - Recommend daily broad spectrum sunscreen SPF 30+ to sun-exposed areas, reapply every 2 hours as needed.  - Staying in the shade or wearing long sleeves, sun glasses (UVA+UVB protection) and wide brim  hats (4-inch brim around the entire circumference of the hat) are also recommended for sun protection.  - Call for new or changing lesions.  LENTIGINES, SEBORRHEIC KERATOSES, HEMANGIOMAS - Benign normal skin lesions - Benign-appearing - Call for any changes  MELANOCYTIC NEVI - Tan-brown and/or pink-flesh-colored symmetric macules and papules - Benign appearing on exam today - Observation - Call clinic for new or changing moles - Recommend daily use of broad spectrum spf 30+ sunscreen to sun-exposed areas.   SEBORRHEIC DERMATITIS Exam: Pink scaly patch at right postauricular sulcus   Chronic and persistent condition with duration or expected duration over one year. Condition is symptomatic/ bothersome to patient. Not currently at goal.   Seborrheic Dermatitis is a chronic persistent rash characterized by pinkness and scaling most commonly of the mid face but also can occur on the scalp (dandruff), ears; mid chest, mid back and groin.  It tends to be exacerbated by stress and cooler weather.  People who have neurologic disease may experience new onset or exacerbation of existing seborrheic dermatitis.  The condition is not curable but treatable and can be controlled.  Treatment Plan: Offered keto cream. Patient prefers to continue OTC Aquaphor for moisture. Patient will call back if not well controlled.   ROSACEA Exam Mid face erythema with telangiectasias + few inflammatory papules at nose R medial cheek  Chronic and persistent condition with duration or expected duration over one year. Condition is symptomatic/ bothersome to patient. Not currently at goal.   Rosacea is a chronic progressive skin condition usually affecting the face of adults, causing redness and/or acne bumps. It is treatable but not curable.  It sometimes affects the eyes (ocular rosacea) as well. It may respond to topical and/or systemic medication and can flare with stress, sun exposure, alcohol, exercise, topical  steroids (including hydrocortisone/cortisone 10) and some foods.  Daily application of broad spectrum spf 30+ sunscreen to face is recommended to reduce flares.    Treatment Plan Start metronidazole  0.75% cream, apply topically twice daily to the affected areas of the face. Will help bumps. Wont help redness  STASIS DERMATITIS Exam: Erythematous, scaly patches involving the ankle and distal lower leg with associated lower leg pitting edema.  Chronic and persistent condition with duration or expected duration over one year. Condition is symptomatic/ bothersome to patient. Not currently at goal.   Stasis in the legs causes chronic leg swelling, which may result in itchy or painful rashes, skin discoloration, skin texture changes, and sometimes ulceration.  Recommend daily graduated compression hose/stockings- easiest to put on first thing in morning, remove at bedtime.  Elevate legs as much as possible. Avoid salt/sodium rich foods.  Treatment Plan: Recommend 10-15 mmhg compression stockings and elevation daily all day as tolerated. Increase pressure as tolerated  NEOPLASM OF UNCERTAIN BEHAVIOR (2) Left Frontal Scalp - Skin / nail biopsy Type of biopsy: tangential   Informed consent: discussed and consent obtained   Timeout: patient name, date of birth, surgical site, and procedure verified   Procedure prep:  Patient was prepped and draped in usual sterile fashion Prep type:  Isopropyl alcohol Anesthesia: the lesion was anesthetized in a standard fashion   Anesthetic:  1% lidocaine  w/ epinephrine  1-100,000 buffered w/ 8.4% NaHCO3 Instrument used: DermaBlade   Hemostasis achieved with: pressure and aluminum chloride   Outcome: patient tolerated procedure well   Post-procedure details: sterile dressing applied and wound care instructions given   Dressing type: bandage and petrolatum    Specimen 1 - Surgical pathology Differential Diagnosis: AK vs SCC  Check Margins: No 6 mm pink  scaly papule Right lower posterior leg - Skin / nail biopsy Type of biopsy: tangential   Informed consent: discussed and consent obtained   Timeout: patient name, date of birth, surgical site, and procedure verified   Procedure prep:  Patient was prepped and draped in usual sterile fashion Prep type:  Isopropyl alcohol Anesthesia: the lesion was anesthetized in a standard fashion   Anesthetic:  1% lidocaine  w/ epinephrine  1-100,000 buffered w/ 8.4% NaHCO3 Instrument used: DermaBlade   Hemostasis achieved with: pressure and aluminum chloride   Outcome: patient tolerated procedure well   Post-procedure details: sterile dressing applied and wound care instructions given   Dressing type: bandage and petrolatum    Specimen 2 - Surgical pathology Differential Diagnosis: Dysplastic vs melanoma  Check Margins: No 4 mm brown to black macule Leg wound from biopsy may take up to 3 months to heal given location and edema [place]ON (2) Right chest (2) - Destruction of lesion - Right chest (2) Complexity: simple   Destruction method: cryotherapy   Informed consent: discussed and consent obtained   Timeout:  patient name, date of birth, surgical site, and procedure verified Lesion destroyed using liquid nitrogen: Yes   Region frozen until ice ball extended beyond lesion: Yes   Cryo cycles: 1 or 2. Outcome: patient tolerated procedure well with no complications   Post-procedure details: wound care instructions given    MULTIPLE BENIGN NEVI   LENTIGINES   ACTINIC ELASTOSIS   SEBORRHEIC KERATOSES   CHERRY ANGIOMA   SEBACEOUS HYPERPLASIA   ROSACEA   SEBORRHEIC DERMATITIS  VENOUS STASIS DERMATITIS OF BOTH LOWER EXTREMITIES    Return in about 1 year (around 09/19/2025) for TBSE, w/ Dr. Claudene.  IAlmetta Nora, RMA, am acting as scribe for Boneta Claudene, MD .   Documentation: I have reviewed the above documentation for accuracy and completeness, and I agree  with the above.  Boneta Claudene, MD    "

## 2024-09-19 NOTE — Patient Instructions (Addendum)
 Recommend 10-15 mmhg compression stockings.   Wound Care Instructions  Cleanse wound gently with soap and water  once a day then pat dry with clean gauze. Apply a thin coat of Petrolatum (petroleum jelly, Vaseline) over the wound (unless you have an allergy to this). We recommend that you use a new, sterile tube of Vaseline. Do not pick or remove scabs. Do not remove the yellow or white healing tissue from the base of the wound.  Cover the wound with fresh, clean, nonstick gauze and secure with paper tape. You may use Band-Aids in place of gauze and tape if the wound is small enough, but would recommend trimming much of the tape off as there is often too much. Sometimes Band-Aids can irritate the skin.  You should call the office for your biopsy report after 1 week if you have not already been contacted.  If you experience any problems, such as abnormal amounts of bleeding, swelling, significant bruising, significant pain, or evidence of infection, please call the office immediately.  FOR ADULT SURGERY PATIENTS: If you need something for pain relief you may take 1 extra strength Tylenol  (acetaminophen ) AND 2 Ibuprofen (200mg  each) together every 4 hours as needed for pain. (do not take these if you are allergic to them or if you have a reason you should not take them.) Typically, you may only need pain medication for 1 to 3 days.   Due to recent changes in healthcare laws, you may see results of your pathology and/or laboratory studies on MyChart before the doctors have had a chance to review them. We understand that in some cases there may be results that are confusing or concerning to you. Please understand that not all results are received at the same time and often the doctors may need to interpret multiple results in order to provide you with the best plan of care or course of treatment. Therefore, we ask that you please give us  2 business days to thoroughly review all your results before  contacting the office for clarification. Should we see a critical lab result, you will be contacted sooner.   If You Need Anything After Your Visit  If you have any questions or concerns for your doctor, please call our main line at [phone] and press option 4 to reach your doctor's medical assistant. If no one answers, please leave a voicemail as directed and we will return your call as soon as possible. Messages left after 4 pm will be answered the following business day.   You may also send us  a message via MyChart. We typically respond to MyChart messages within 1-2 business days.  For prescription refills, please ask your pharmacy to contact our office. Our fax number is [phone].  If you have an urgent issue when the clinic is closed that cannot wait until the next business day, you can page your doctor at the number below.    Please note that while we do our best to be available for urgent issues outside of office hours, we are not available 24/7.   If you have an urgent issue and are unable to reach us , you may choose to seek medical care at your doctor's office, retail clinic, urgent care center, or emergency room.  If you have a medical emergency, please immediately call 911 or go to the emergency department.  Pager Numbers  - Dr. Hester: [phone]  - Dr. Jackquline: [phone]  - Dr. Claudene: [phone]   - Dr. Raymund: [phone]  In  the event of inclement weather, please call our main line at [phone] for an update on the status of any delays or closures.  Dermatology Medication Tips: Please keep the boxes that topical medications come in in order to help keep track of the instructions about where and how to use these. Pharmacies typically print the medication instructions only on the boxes and not directly on the medication tubes.   If your medication is too expensive, please contact our office at [phone] option 4 or send us  a message through  MyChart.   We are unable to tell what your co-pay for medications will be in advance as this is different depending on your insurance coverage. However, we may be able to find a substitute medication at lower cost or fill out paperwork to get insurance to cover a needed medication.   If a prior authorization is required to get your medication covered by your insurance company, please allow us  1-2 business days to complete this process.  Drug prices often vary depending on where the prescription is filled and some pharmacies may offer cheaper prices.  The website www.goodrx.com contains coupons for medications through different pharmacies. The prices here do not account for what the cost may be with help from insurance (it may be cheaper with your insurance), but the website can give you the price if you did not use any insurance.  - You can print the associated coupon and take it with your prescription to the pharmacy.  - You may also stop by our office during regular business hours and pick up a GoodRx coupon card.  - If you need your prescription sent electronically to a different pharmacy, notify our office through [facility] or by phone at [phone] option 4.     Si Usted Necesita Algo Despus de Su Visita  Tambin puede enviarnos un mensaje a travs de Clinical Cytogeneticist. Por lo general respondemos a los mensajes de MyChart en el transcurso de 1 a 2 das hbiles.  Para renovar recetas, por favor pida a su farmacia que se ponga en contacto con nuestra oficina. Randi lakes de fax es [place] [phone].  Si tiene un asunto urgente cuando la clnica est cerrada y que no puede esperar hasta el siguiente da hbil, puede llamar/localizar a su doctor(a) al nmero que aparece a continuacin.   Por favor, tenga en cuenta que aunque hacemos todo lo posible para estar disponibles para asuntos urgentes fuera del horario de [place], no estamos disponibles las 24 horas del da, los 7 [address]  [address] de la  [place].   Si tiene un problema urgente y no puede comunicarse con nosotros, puede optar por buscar atencin mdica  en el consultorio de su doctor(a), en una clnica privada, en un centro de atencin urgente o en una sala de emergencias.  Si tiene engineer, drilling, por favor llame inmediatamente al 911 o vaya a la sala de emergencias.  Nmeros de bper  - Dr. Hester: [phone]  - Dra. Jackquline: [phone]  - Dr. Claudene: [phone]  - Dra. Kitts: [phone]  En caso de inclemencias del [place], por favor llame a nuestra lnea principal al [phone] para una actualizacin sobre el estado de cualquier retraso o cierre.  Consejos para la medicacin en dermatologa: Por favor, guarde las cajas en las que vienen los medicamentos de uso tpico para ayudarle a seguir las instrucciones sobre dnde y cmo usarlos. Las farmacias generalmente imprimen las instrucciones del medicamento slo en las cajas y no directamente en los tubos  del medicamento.   Si su medicamento es muy caro, por favor, pngase en contacto con landry rieger llamando al [phone] y presione la opcin 4 o envenos un mensaje a travs de Clinical Cytogeneticist.   No podemos decirle cul ser su copago por los medicamentos por adelantado ya que esto es diferente dependiendo de la cobertura de su seguro. Sin embargo, es posible que podamos encontrar un medicamento sustituto a audiological scientist un formulario para que el seguro cubra el medicamento que se considera necesario.   Si se requiere una autorizacin previa para que su compaa de seguros cubra su medicamento, por favor permtanos de 1 a 2 das hbiles para completar este proceso.  Los precios de los medicamentos varan con frecuencia dependiendo del environmental consultant de dnde se surte la receta y alguna farmacias pueden ofrecer precios ms baratos.  El sitio web www.goodrx.com tiene cupones para medicamentos de health and safety inspector. Los precios aqu no tienen en cuenta lo que  podra costar con la ayuda del seguro (puede ser ms barato con su seguro), pero el sitio web puede darle el precio si no utiliz tourist information centre manager.  - Puede imprimir el cupn correspondiente y llevarlo con su receta a la farmacia.  - Tambin puede pasar por nuestra oficina durante el horario de atencin regular y education officer, museum una tarjeta de cupones de GoodRx.  - Si necesita que su receta se enve electrnicamente a una farmacia diferente, informe a nuestra oficina a travs de MyChart de [place] o por telfono llamando al [phone] y presione la opcin 4.

## 2024-09-21 LAB — SURGICAL PATHOLOGY

## 2024-09-26 ENCOUNTER — Ambulatory Visit: Payer: Self-pay | Admitting: Dermatology

## 2024-09-27 ENCOUNTER — Encounter: Payer: Self-pay | Admitting: Dermatology

## 2024-09-27 NOTE — Telephone Encounter (Signed)
-----   Message from Boneta Sharps, MD sent at 09/26/2024 10:37 PM EST ----- Diagnosis 1. 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 Skin, right lower posterior leg :       HEMOSIDERIN PIGMENT DEPOSITION, PLEASE SEE NOTE    Please call with diagnosis and message me with patient's decision on treatment.  L scalp Explanation: Biopsy shows a squamous cell skin cancer limited to the top layer of skin. This means it is an early cancer and has not spread. However, it has the potential to spread beyond the skin  and threaten your health, so we recommend treating it.   Treatment option 1: a cream (fluorouracil and calcipotriene) that helps your immune system clear the skin cancer. It will cause redness and irritation. Wait two weeks after the biopsy to start  applying the cream. Apply the cream twice per day until the redness and irritation develop (usually occurs by day 7), then stop and allow it to heal. We will recheck the area in 2 months to ensure  the cancer is gone. The cream is $45 plus shipping and will be mailed to you from a low cost compounding pharmacy.  Treatment option 2: you return for a brief appointment where I perform electrodesiccation and curettage [facility]). This involves three rounds of scraping and burning to destroy the skin cancer. It has  about an 85% cure rate and leaves a round wound slightly larger than the skin cancer and leaves a round white scar. No additional pathology is done. If the skin cancer comes back, we would need to do  a surgery to remove it.  2. R lower leg Biopsy shows chronic changes associated with chronic lower leg edema. No skin cancer was detected. Continue compression socks daily

## 2024-09-27 NOTE — Telephone Encounter (Signed)
 Attempted to reach patient discuss biopsy results.

## 2024-10-03 MED ORDER — FLUOROURACIL 5 % EX CREA: TOPICAL_CREAM | CUTANEOUS | 0 refills | Status: AC

## 2024-10-03 NOTE — Addendum Note (Signed)
 Addended by: TERESA PALMA R on: 10/03/2024 08:58 AM   Modules accepted: Orders

## 2024-10-03 NOTE — Telephone Encounter (Signed)
 Patient and spouse advised of BX results.  He would like to do topical 5FU/Calcipotriene Cream and scheduled follow up in 2 months. aw

## 2024-10-04 ENCOUNTER — Telehealth: Payer: Self-pay

## 2024-10-04 DIAGNOSIS — D099 Carcinoma in situ, unspecified: Secondary | ICD-10-CM

## 2024-10-04 NOTE — Telephone Encounter (Signed)
 Patient and wife called and left message on voice mail stating they would like to pursue Moh's treatment for SCC at left frontal scalp.  Patient and wife talked to [place] on 10/03/2024 and was on board with treatment with 5FU/Calcipotriene cream. Patient states he changed his mind and prefers Mohs treatment instead with Dr. Bluford. Please advise

## 2024-11-28 ENCOUNTER — Ambulatory Visit: Admitting: Dermatology

## 2025-09-11 ENCOUNTER — Ambulatory Visit: Admitting: Dermatology
